# Patient Record
Sex: Male | Born: 1935 | Race: White | Hispanic: No | Marital: Married | Smoking: Former smoker
Health system: Southern US, Community
[De-identification: ages and names within clinical notes are randomized; demographics above are authoritative.]

## PROBLEM LIST (undated history)

## (undated) DIAGNOSIS — K279 Peptic ulcer, site unspecified, unspecified as acute or chronic, without hemorrhage or perforation: Secondary | ICD-10-CM

## (undated) DIAGNOSIS — N400 Enlarged prostate without lower urinary tract symptoms: Secondary | ICD-10-CM

## (undated) DIAGNOSIS — I10 Essential (primary) hypertension: Secondary | ICD-10-CM

## (undated) DIAGNOSIS — M1990 Unspecified osteoarthritis, unspecified site: Secondary | ICD-10-CM

## (undated) DIAGNOSIS — I2510 Atherosclerotic heart disease of native coronary artery without angina pectoris: Secondary | ICD-10-CM

## (undated) DIAGNOSIS — K219 Gastro-esophageal reflux disease without esophagitis: Secondary | ICD-10-CM

## (undated) DIAGNOSIS — G8929 Other chronic pain: Secondary | ICD-10-CM

## (undated) HISTORY — PX: KNEE SURGERY: SHX244

## (undated) HISTORY — PX: CAROTID ENDARTERECTOMY: SUR193

---

## 2010-11-04 ENCOUNTER — Emergency Department: Payer: Self-pay | Admitting: Internal Medicine

## 2011-09-01 ENCOUNTER — Observation Stay: Payer: Self-pay | Admitting: Family Medicine

## 2011-09-01 LAB — COMPREHENSIVE METABOLIC PANEL
Albumin: 3.7 g/dL (ref 3.4–5.0)
Anion Gap: 12 (ref 7–16)
BUN: 22 mg/dL — ABNORMAL HIGH (ref 7–18)
Bilirubin,Total: 0.7 mg/dL (ref 0.2–1.0)
Chloride: 100 mmol/L (ref 98–107)
Creatinine: 1.03 mg/dL (ref 0.60–1.30)
EGFR (African American): >60
Osmolality: 277 (ref 275–301)
Potassium: 4.2 mmol/L (ref 3.5–5.1)
Sodium: 137 mmol/L (ref 136–145)
Total Protein: 8.1 g/dL (ref 6.4–8.2)

## 2011-09-01 LAB — CK TOTAL AND CKMB (NOT AT ARMC): CK, Total: 32 U/L — ABNORMAL LOW (ref 35–232)

## 2011-09-01 LAB — CBC
MCH: 31.4 pg (ref 26.0–34.0)
MCHC: 33.9 g/dL (ref 32.0–36.0)
MCV: 93 fL (ref 80–100)
Platelet: 193 10*3/uL (ref 150–440)
RBC: 4.5 10*6/uL (ref 4.40–5.90)
RDW: 12.9 % (ref 11.5–14.5)

## 2011-09-01 LAB — URINALYSIS, COMPLETE
Bilirubin,UR: NEGATIVE
Blood: NEGATIVE
Ketone: NEGATIVE
Leukocyte Esterase: NEGATIVE
Ph: 6 (ref 4.5–8.0)
RBC,UR: 2 /HPF (ref 0–5)
Specific Gravity: 1.016 (ref 1.003–1.030)
Squamous Epithelial: NONE SEEN

## 2011-09-01 LAB — TROPONIN I: Troponin-I: <0.02 ng/mL

## 2011-09-01 LAB — PROTIME-INR: Prothrombin Time: 14.1 secs (ref 11.5–14.7)

## 2011-09-02 LAB — BASIC METABOLIC PANEL
Anion Gap: 11 (ref 7–16)
Calcium, Total: 8.2 mg/dL — ABNORMAL LOW (ref 8.5–10.1)
Co2: 28 mmol/L (ref 21–32)
Creatinine: 1.33 mg/dL — ABNORMAL HIGH (ref 0.60–1.30)
EGFR (African American): >60
EGFR (Non-African Amer.): 54 — ABNORMAL LOW
Glucose: 111 mg/dL — ABNORMAL HIGH (ref 65–99)
Osmolality: 287 (ref 275–301)
Sodium: 140 mmol/L (ref 136–145)

## 2011-09-03 ENCOUNTER — Encounter: Payer: Self-pay | Admitting: Internal Medicine

## 2011-09-03 LAB — BASIC METABOLIC PANEL
Anion Gap: 10 (ref 7–16)
Calcium, Total: 8 mg/dL — ABNORMAL LOW (ref 8.5–10.1)
Chloride: 102 mmol/L (ref 98–107)
Co2: 26 mmol/L (ref 21–32)
EGFR (African American): >60
EGFR (Non-African Amer.): >60
Glucose: 99 mg/dL (ref 65–99)
Osmolality: 280 (ref 275–301)
Potassium: 4.3 mmol/L (ref 3.5–5.1)
Sodium: 138 mmol/L (ref 136–145)

## 2011-09-03 LAB — CBC WITH DIFFERENTIAL/PLATELET
Basophil #: 0 10*3/uL (ref 0.0–0.1)
Basophil %: 0.5 %
Eosinophil #: 0.2 10*3/uL (ref 0.0–0.7)
Eosinophil %: 3.5 %
HGB: 11.5 g/dL — ABNORMAL LOW (ref 13.0–18.0)
Lymphocyte %: 25.9 %
MCH: 31.4 pg (ref 26.0–34.0)
MCHC: 33.7 g/dL (ref 32.0–36.0)
Monocyte #: 0.7 10*3/uL (ref 0.0–0.7)
Neutrophil %: 55.9 %
Platelet: 169 10*3/uL (ref 150–440)
RDW: 12.6 % (ref 11.5–14.5)
WBC: 4.9 10*3/uL (ref 3.8–10.6)

## 2011-09-07 ENCOUNTER — Encounter: Payer: Self-pay | Admitting: Internal Medicine

## 2013-09-22 ENCOUNTER — Emergency Department: Payer: Self-pay | Admitting: Emergency Medicine

## 2014-10-31 ENCOUNTER — Emergency Department
Admit: 2014-10-31 | Disposition: A | Discharge status: Home / Self Care | Payer: Self-pay | Admitting: Emergency Medicine

## 2014-10-31 LAB — CBC
HCT: 38.1 % — ABNORMAL LOW (ref 40.0–52.0)
HGB: 12.5 g/dL — ABNORMAL LOW (ref 13.0–18.0)
MCH: 31.2 pg (ref 26.0–34.0)
MCHC: 32.8 g/dL (ref 32.0–36.0)
MCV: 95 fL (ref 80–100)
PLATELETS: 201 10*3/uL (ref 150–440)
RBC: 4.01 10*6/uL — AB (ref 4.40–5.90)
RDW: 13 % (ref 11.5–14.5)
WBC: 7.4 10*3/uL (ref 3.8–10.6)

## 2014-10-31 LAB — COMPREHENSIVE METABOLIC PANEL
ALBUMIN: 3.9 g/dL
AST: 31 U/L
Alkaline Phosphatase: 89 U/L
Anion Gap: 10 (ref 7–16)
BUN: 23 mg/dL — AB
Bilirubin,Total: 0.6 mg/dL
CALCIUM: 8.9 mg/dL
CHLORIDE: 101 mmol/L
CO2: 24 mmol/L
CREATININE: 1.19 mg/dL
EGFR (Non-African Amer.): 53 — ABNORMAL LOW
Glucose: 128 mg/dL — ABNORMAL HIGH
Potassium: 4.8 mmol/L
SGPT (ALT): 9 U/L — ABNORMAL LOW
Sodium: 135 mmol/L
Total Protein: 7.7 g/dL

## 2014-10-31 LAB — URINALYSIS, COMPLETE
Bacteria: NONE SEEN
Bilirubin,UR: NEGATIVE
Blood: NEGATIVE
Glucose,UR: NEGATIVE mg/dL (ref 0–75)
Leukocyte Esterase: NEGATIVE
Nitrite: NEGATIVE
PH: 5 (ref 4.5–8.0)
Protein: 30
SQUAMOUS EPITHELIAL: NONE SEEN
Specific Gravity: 1.02 (ref 1.003–1.030)

## 2014-10-31 LAB — LIPASE, BLOOD: Lipase: 23 U/L

## 2014-10-31 LAB — TROPONIN I
TROPONIN-I: 0.04 ng/mL — AB
Troponin-I: 0.04 ng/mL — ABNORMAL HIGH

## 2014-10-31 NOTE — Consult Note (Signed)
Brief Consult Note: Diagnosis: Compression fracture, ostteoarthritis, no neurologic deficit.   Patient was seen by consultant.   Comments: Discussed with patient. Will see hjow he does with Physical Therapy and corset. Will need to be off plavix for 1 week if he decides to proceed. Has no restricitons.  Electronic Signatures: Celesta Gentilealiff, Blakley Michna C (MD)  (Signed [phone]-Feb-13 15:02)  Authored: Brief Consult Note   Last Updated: 24-Feb-13 15:02 by Celesta Gentilealiff, Gustava Berland C (MD)

## 2014-10-31 NOTE — Discharge Summary (Signed)
PATIENT NAME:  Aaron Erickson, Aaron Erickson MR#:  [id number] DATE OF BIRTH:  [birth year]  DATE OF ADMISSION:  09/01/2011 DATE OF DISCHARGE:  09/03/2011  DISCHARGE DIAGNOSES:  1. Acute back pain secondary to multilevel compression fractures.  2. Hypertension.  3. History of coronary artery disease.  4. Hyperlipidemia.   DISCHARGE MEDICATIONS:  1. Plavix 75 mg p.o. daily.  2. Hydrochlorothiazide 25 mg p.o. daily.  3. Lisinopril 20 mg p.o. daily.  4. Lopressor 100 mg p.o. daily.  5. Mevacor 40 mg p.o. daily. 6. Vitamin D/Calcium 800/1200 mg p.o. daily.  7. Calcitonin 200 mcg spray into nostrils alternating each day.  8. Tylenol 1000 mg t.i.d. scheduled p.o.  9. Ultram 50 mg p.o. t.i.d. p.r.n. for pain.  10. Oxycodone 5 mg p.o. q.4 hours p.r.n. for pain.  11. Ranitidine 150 mg p.o. b.i.d.  12. Hydroxyzine 25 mg p.o. q.6 hours p.r.n. for itching.   CONSULT: Dr. Gerrit Heckaliff with [facility]    PROCEDURES: None.   PERTINENT LABS: MRI of the back showed multilevel compression fractures. X-ray of the cervical spine was negative for acute fracture. Creatinine of 1.03.   BRIEF HOSPITAL COURSE:  1. Acute back pain secondary to compression fractures. The patient initially came in with moderate to severe back pain, found to have multilevel compression fractures seen on both CT and MRI. He was evaluated by Dr. Gerrit Heckaliff who did not recommend surgery at this time, would wait to see how he tolerates the course and the physical therapy and will need to hold the Plavix prior to any procedure. Plans are for him to go to [facility] for further rehab with PT and OT and pain control with meds listed above.  2. Other chronic medical issues remained stable. No changes to those regimens.   He is stable enough for discharge to [facility] for rehab. Follow-up with Dr. Burnadette PopLinthavong in 1 to 2 weeks.   ____________________________ Marisue IvanKanhka [name], MD kl:drc D: 09/03/2011 12:25:33  ET T: 09/03/2011 12:31:49 ET JOB#: [id number]  cc: Marisue IvanKanhka Lydiann Bonifas, MD, <Dictator> Marisue IvanKANHKA Mersadez Linden MD ELECTRONICALLY SIGNED 09/12/2011 13:35

## 2014-10-31 NOTE — H&P (Signed)
PATIENT NAME:  Aaron Erickson, Aaron Erickson MR#:  [id number] DATE OF BIRTH:  18-Apr-1921  DATE OF ADMISSION:  09/01/2011  PRIMARY CARE PHYSICIAN: Diona Fanti, MD  REFERRING PHYSICIAN: Lowella Fairy, MD  CHIEF COMPLAINT: Back pain for two days.   HISTORY OF PRESENT ILLNESS: The patient is an 81 year old Caucasian male with a history of hypertension, coronary artery disease, PVD, rheumatoid arthritis, and benign prostatic hypertrophy who presented to the ED with back pain for two days. The patient is alert, awake, and oriented but has hearing loss. According to him, he has had constipation for several days. He tried to have a bowel movement on Thursday, but developed back pain which is constant, sharp, and severe with no radiation. He could not walk due to back pain. Movement exacerbated the pain. In addition, he had neck pain on the left side today; however, the patient denies any extremity weakness, numbness, or tingling; no incontinence. The patient mentioned that he had a back injury 30 years ago. He was in bed for two weeks. He also remembered that he fell by accident last year, but no recent fall or trauma. He uses a walker and he lives alone. MRI of the spine showed acute and chronic spine fracture. The patient's blood pressure was high, more than 200, in the ED, and now it is 163. The patient still complains of neck pain and back pain.   PAST MEDICAL HISTORY:  1. Hypertension.  2. Coronary artery disease.  3. Peripheral vascular disease.  4. Benign prostatic hypertrophy.  5. Hyperlipidemia. 6. Gastroesophageal reflux disease.  7. Rheumatoid arthritis.  DRUG ALLERGIES: Sulfa.   MEDICATIONS:  1. Benadryl 25 mg p.o. three times daily p.r.n.  2. Plavix 75 mg p.o. daily.  3. HCTZ 25 mg p.o. daily.  4. Lisinopril 20 mg p.o. daily.  5. Lopressor 100 mg p.o. daily.  6. Mevacor 40 mg p.o. daily.  7. Ranitidine 150 mg p.o. twice daily.  8. Tylenol 500 mg p.o. every 6 hours p.r.n. 9. Hydroxyzine  hydrochloride 25 mg p.o. four times daily p.r.n.   SOCIAL HISTORY: He denies any smoking, alcohol drinking, or illicit drugs.   FAMILY HISTORY: Hypertension.  REVIEW OF SYSTEMS: CONSTITUTIONAL: The patient denies any fever, sometimes headache, but no dizziness. No weight loss. EYES: No blurry vision, double vision, or discharge. ENT: Has hearing loss, but no ear pain or epistaxis. No postnasal drip. RESPIRATORY: No cough, sputum, shortness of breath, or hemoptysis. CARDIOVASCULAR: No chest pain, palpitations, or orthopnea. No nocturnal dyspnea. GI: No abdominal pain, nausea, vomiting, or diarrhea. No melena or bloody stool. GU: No dysuria, hematuria, or incontinence. SKIN: No rash or jaundice. ENDOCRINE: No polyuria or polydipsia. HEMATOLOGY: No easy bruising or bleeding. NEUROLOGY: No syncope, loss of consciousness, or seizure. MUSCULOSKELETAL: Positive for back pain and neck pain. No edema. PSYCH: No anxiety or depression.   PHYSICAL EXAMINATION:   VITALS: Temperature 96.2, blood pressure 163/81, pulse 51, respirations 18, oxygen saturation 96% on room air.   GENERAL: The patient is alert, awake, and oriented, in no acute distress.   HEENT: Pupils are round, equal, and reactive to light and accommodation. Moist oral mucosa. Clear oropharynx.   NECK: Supple. No JVD or carotid bruits. No lymphadenopathy. No thyromegaly.   CARDIOVASCULAR: S1 and S2 regular rate and rhythm, bradycardia. No murmurs, rubs, or gallops.   PULMONARY: Bilateral air entry. No wheezing or rales. No use of accessory muscles to breathe.   ABDOMEN: Soft. No distention. No tenderness. No organomegaly. Bowel sounds present.  EXTREMITIES: No edema, clubbing, or cyanosis. No calf tenderness. Strong pedal pulses.  NEUROLOGIC: Alert and oriented x3. No focal deficits. Power 5/5. Sensation intact.   SKIN: No rash or jaundice.   LABS/STUDIES: MRI showed an acute benign appearing mild to moderate compression fracture of L2,  chronic moderate compression fracture of L1, and mild compression fractures of T12 and L4, and mild multilevel disc and facet disease.  Urinalysis is negative.  CAT scan of the abdomen and pelvis: No abdominal aortic aneurysm. No acute findings of abdomen or pelvis. Mild compression fractures of T12, L2, L4, and L1. Large hiatal hernia.   Troponin less than 0.02. CK 32. INR 1.1. PT 14.1. WBC 8.3, hemoglobin 14.1, platelets 193. Glucose 96, BUN 22, creatinine 1.03, sodium 137, potassium 4.2, bicarbonate 25, chloride 100. BNP 969.   Chest x-ray: Mild right basilar opacities likely secondary to atelectasis or scarring. Moderate size hiatal hernia.   IMPRESSION:  1. Back pain.  2. Multiple spine fractures.  3. Hypertensive emergency.  4. Dehydration.  5. Bradycardia. 6. Constipation.  7. History of coronary artery disease, peripheral vascular disease, benign prostatic hypertrophy, and rheumatoid arthritis.  PLAN OF TREATMENT: The patient will be placed for observation. We will give pain management with morphine p.r.n. and Fentanyl patch. We will continue Lopressor and lisinopril, but discontinue HCTZ due to dehydration, and add hydralazine. We will give half normal saline IV and follow up BMP. For constipation we will give Colace and Milk of Magnesia p.r.n. We will get a PT evaluation and case management for nursing home placement due to the patient's status. I discussed with the patient the  situation and the plan of treatment with the patient and the patient's nephew.   TIME SPENT: Approximately 70 minutes. ____________________________ Shaune PollackQing Luka Reisch, MD qc:slb D: 09/01/2011 21:58:32 ET T: 09/02/2011 09:27:43 ET JOB#: [id number]  cc: Shaune PollackQing Zeola Brys, MD, <Dictator> Carla DrapeLarry O. Harper, MD Shaune PollackQING Treyana Sturgell MD ELECTRONICALLY SIGNED 09/02/2011 20:19

## 2014-11-02 ENCOUNTER — Inpatient Hospital Stay
Admit: 2014-11-02 | Disposition: A | Discharge status: Other Acute Inpatient Hospital | Payer: Self-pay | Attending: Internal Medicine | Admitting: Internal Medicine

## 2014-11-02 LAB — TROPONIN I
Troponin-I: 0.28 ng/mL — ABNORMAL HIGH
Troponin-I: 0.23 ng/mL — ABNORMAL HIGH

## 2014-11-02 LAB — BASIC METABOLIC PANEL
Anion Gap: 9 (ref 7–16)
BUN: 30 mg/dL — ABNORMAL HIGH
CHLORIDE: 101 mmol/L
CO2: 26 mmol/L
CREATININE: 1.43 mg/dL — AB
Calcium, Total: 8.2 mg/dL — ABNORMAL LOW
GFR CALC AF AMER: 49 — AB
GFR CALC NON AF AMER: 42 — AB
Glucose: 109 mg/dL — ABNORMAL HIGH
Potassium: 4.2 mmol/L
Sodium: 136 mmol/L

## 2014-11-02 LAB — URINALYSIS, COMPLETE
Bilirubin,UR: NEGATIVE
Blood: NEGATIVE
Glucose,UR: NEGATIVE mg/dL (ref 0–75)
LEUKOCYTE ESTERASE: NEGATIVE
Nitrite: NEGATIVE
Ph: 5 (ref 4.5–8.0)
Protein: 100
Specific Gravity: 1.027 (ref 1.003–1.030)
Squamous Epithelial: NONE SEEN

## 2014-11-02 LAB — CBC WITH DIFFERENTIAL/PLATELET
BASOS ABS: 0 10*3/uL (ref 0.0–0.1)
Basophil %: 0.7 %
EOS ABS: 0 10*3/uL (ref 0.0–0.7)
Eosinophil %: 0.1 %
HCT: 37.1 % — ABNORMAL LOW (ref 40.0–52.0)
HGB: 12.3 g/dL — ABNORMAL LOW (ref 13.0–18.0)
LYMPHS ABS: 1.1 10*3/uL (ref 1.0–3.6)
LYMPHS PCT: 26.5 %
MCH: 31.6 pg (ref 26.0–34.0)
MCHC: 33.1 g/dL (ref 32.0–36.0)
MCV: 95 fL (ref 80–100)
MONOS PCT: 18.8 %
Monocyte #: 0.8 x10 3/mm (ref 0.2–1.0)
NEUTROS ABS: 2.3 10*3/uL (ref 1.4–6.5)
NEUTROS PCT: 53.9 %
Platelet: 190 10*3/uL (ref 150–440)
RBC: 3.89 10*6/uL — AB (ref 4.40–5.90)
RDW: 13.8 % (ref 11.5–14.5)
WBC: 4.2 10*3/uL (ref 3.8–10.6)

## 2014-11-02 LAB — HEPATIC FUNCTION PANEL A (ARMC)
AST: 42 U/L — AB
Albumin: 3.4 g/dL — ABNORMAL LOW
Alkaline Phosphatase: 74 U/L
BILIRUBIN TOTAL: 0.3 mg/dL
SGPT (ALT): 15 U/L — ABNORMAL LOW
TOTAL PROTEIN: 6.6 g/dL

## 2014-11-02 LAB — PRO B NATRIURETIC PEPTIDE: B-Type Natriuretic Peptide: 174 pg/mL — ABNORMAL HIGH

## 2014-11-03 LAB — TROPONIN I: Troponin-I: 0.17 ng/mL — ABNORMAL HIGH

## 2014-11-03 LAB — BASIC METABOLIC PANEL
ANION GAP: 7 (ref 7–16)
BUN: 25 mg/dL — AB
CHLORIDE: 105 mmol/L
Calcium, Total: 7.9 mg/dL — ABNORMAL LOW
Co2: 23 mmol/L
Creatinine: 1.08 mg/dL
EGFR (African American): >60
EGFR (Non-African Amer.): 59 — ABNORMAL LOW
Glucose: 127 mg/dL — ABNORMAL HIGH
Potassium: 4.3 mmol/L
Sodium: 135 mmol/L

## 2014-11-03 LAB — CBC WITH DIFFERENTIAL/PLATELET
BASOS ABS: 0 10*3/uL (ref 0.0–0.1)
Basophil %: 0.7 %
Eosinophil #: 0 10*3/uL (ref 0.0–0.7)
Eosinophil %: 0 %
HCT: 36.7 % — AB (ref 40.0–52.0)
HGB: 11.8 g/dL — ABNORMAL LOW (ref 13.0–18.0)
LYMPHS PCT: 20.1 %
Lymphocyte #: 0.9 10*3/uL — ABNORMAL LOW (ref 1.0–3.6)
MCH: 30.8 pg (ref 26.0–34.0)
MCHC: 32.2 g/dL (ref 32.0–36.0)
MCV: 96 fL (ref 80–100)
Monocyte #: 0.1 x10 3/mm — ABNORMAL LOW (ref 0.2–1.0)
Monocyte %: 3.2 %
NEUTROS ABS: 3.4 10*3/uL (ref 1.4–6.5)
Neutrophil %: 76 %
PLATELETS: 162 10*3/uL (ref 150–440)
RBC: 3.85 10*6/uL — ABNORMAL LOW (ref 4.40–5.90)
RDW: 13.5 % (ref 11.5–14.5)
WBC: 4.5 10*3/uL (ref 3.8–10.6)

## 2014-11-07 NOTE — H&P (Signed)
PATIENT NAME:  Aaron Erickson, Aaron Erickson MR#:  [id number] DATE OF BIRTH:  April 14, 1921  DATE OF ADMISSION:  11/02/2014  PRIMARY CARE PROVIDER:  Marisue Ivan, MD.     CHIEF COMPLAINT: Weakness, cough, shortness of breath.   HISTORY OF PRESENT ILLNESS:  A 81 year old male patient with history of CAD, hypertension, rheumatoid arthritis, who was recently seen in the Emergency Room for pneumonia 3 days prior, returns with increasing weakness. The patient mentioned that he has had cough with brown sputum, but no blood. He has also had some shortness of breath and increasing weakness. He normally ambulates with a walker, but has been unable to do that due to weakness. He is not needing any oxygen. He is afebrile, but in spite of being on outpatient therapy for pneumonia the patient has worsened. Also his troponin is elevated at 0.28 and he is being admitted to the hospitalist service.   Today chest x-ray shows small right-sided pleural effusion, large hiatal hernia, no infiltrate, no  edema. He does not complain of any orthopnea or lower extremity edema, has had good urine output. He had 1 episode of diarrhea yesterday. No vomiting. Has had decreased appetite.   PAST MEDICAL HISTORY:  1. CAD.   2. Hypertension.  3. Peripheral vascular disease.  4. BPH.   5. Hyperlipidemia.  6. GERD.  7. Rheumatoid arthritis.   ALLERGIES: SULFA MEDICATIONS.   SOCIAL HISTORY: The patient lives at independent living facility. Does not smoke. No alcohol. No illicit drugs. Ambulates with a walker.   CODE STATUS: DNR/DNI.   FAMILY HISTORY: Hypertension per old records.   REVIEW OF SYSTEMS:   CONSTITUTIONAL: Complains of fatigue and weakness.  EYES: No blurred vision, pain, redness.  EARS, NOSE, AND THROAT: No tinnitus. Does have hearing loss.  RESPIRATORY: He has shortness of breath, cough, brown sputum.  CARDIOVASCULAR: No chest pain, orthopnea, edema.  GASTROINTESTINAL: No nausea, vomiting, diarrhea, abdominal pain.   GENITOURINARY:  No dysuria, hematuria.   ENDOCRINE: No polyuria, nocturia, thyroid problems.  HEMATOLOGIC AND LYMPHATIC: No anemia, easy bruising, bleeding.  INTEGUMENTARY: No acne, rash, lesion.  MUSCULOSKELETAL: Has arthritis, back pain.  NEUROLOGIC: No focal numbness, weakness.  PSYCHIATRIC: No anxiety or depression.   HOME MEDICATIONS:  1. Aspirin 81 mg daily.  2. Plavix 75 mg daily.  3. Losartan 50 mg daily.  4. Calcitonin nasal spray daily.  5. Calcium and vitamin D 1 tablet daily.  6. Cetirizine 10 mg day.  7. Docusate sodium 100 mg 2 times a day as needed for constipation.  8. Fluticasone nasal 2 sprays in each nostril once a day.  9. Gabapentin 100 mg 2 capsules daily at night.  10. Milk of magnesia 30 mL oral once a day as needed for constipation.  11. Zofran 4 mg every 6 hours as needed for nausea and vomiting.  12. Q-Tussin 10 mL oral every 6 hours as needed for cough.  13. RANITIDINE 150 mg 2 times a day.  14. Senna 8.6 mg 2 tablets 2 times a day.  15. Tramadol 50 mg oral 3 times a day as needed for pain.  16. Vitamin B12, 1000 mcg daily.    PHYSICAL EXAMINATION:  VITAL SIGNS: Temperature 98.1, pulse of 78, blood pressure 114/70, saturating 94% on room air.  GENERAL: Elderly, frail Caucasian male patient lying in bed, coughing.  PSYCHIATRIC: Alert and oriented x 3, pleasant.  HEENT: Atraumatic, normocephalic. Oral mucosa moist and pink. External ears and nose normal. No pallor. No icterus. Pupils bilaterally equal  and reactive to light. Decreased hearing. Hearing aid in place.  NECK: Supple. No thyromegaly. No palpable lymph nodes. Trachea midline. No carotid bruit, JVD.  CARDIOVASCULAR: S1, S2, no murmurs. Peripheral pulses 2 +. No edema.  RESPIRATORY: Has bilateral wheezing. Normal work of breathing.  GASTROINTESTINAL: Soft abdomen, nontender. Bowel sounds present. No hepatosplenomegaly palpable.  GENITOURINARY: No CVA tenderness or bladder distention.  SKIN: Warm  and dry. No petechiae, rash, ulcers.  MUSCULOSKELETAL: No joint swelling, redness, effusion of the large joints. Normal muscle tone.  NEUROLOGICAL: Motor strength 5 out of 5 in upper and lower extremities. Sensation to fine touch intact all over.  LYMPHATIC: No cervical lymphadenopathy.   LABORATORY STUDIES AND IMAGING STUDIES: Show glucose of 109. BNP of 174. BUN 30, creatinine 1.43, sodium 136, potassium 4.2, chloride 101 with AST, ALT, alkaline phosphatase, bilirubin normal. Troponin 0.28, recent troponin was 0.04 two days prior. WBC 4.2, hemoglobin 12.3, platelets of 190,000.   Urinalysis shows trace bacteria, 0-5 WBCs.   EKG shows normal sinus rhythm, no acute ST-T wave changes.   Chest x-ray shows large hiatal hernia along with a small right pleural effusion and elevated left diaphragm with atelectasis, no infiltrate or edema found.   ASSESSMENT AND PLAN:  1.  Acute bronchitis. The patient has worsened in spite of being on antibiotics, cough medications as outpatient. At this point he will be started on IV antibiotics along with IV Solu-Medrol and scheduled nebulizer therapy. He also has mild acute renal failure. We will start him on IV fluids for the same. Oxygen as needed.  2.  Elevated troponin. Mild elevation of 0.28, recently it was 0.04. This is likely from demand ischemia. He does have history of coronary artery disease, but with his advanced age he would not be a candidate for cardiac catheterization. If there is significant elevation of troponin we will need to consult cardiology, but at this point I will continue his aspirin, Plavix, and put him on a small dose of beta blocker of metoprolol 25 daily. Also start patient on a statin.  3.  Hypertension. Started on metoprolol and discontinue losartan.  4.  Chronic pain syndrome. Continue his tramadol that he takes at home.  5.  Deep vein thrombosis prophylaxis with Lovenox.  6.  Code status: DNR, DNI as discussed with the patient. He  is alert and oriented x 3, able to make decisions, mentions that his wife was a DNR, did not want any life support, and he would like the same. Tried to called his daughter and granddaughter, but did not answer the phone.   TIME SPENT TODAY ON THIS CASE:  40 minutes.     ____________________________ Molinda BailiffSrikar R. Janat Tabbert, MD srs:bu D: 11/02/2014 21:02:51 ET T: 11/02/2014 21:16:30 ET JOB#: [id number]  cc: Wardell HeathSrikar R. Nur Krasinski, MD, <Dictator> Marisue IvanKanhka Linthavong, MD Orie FishermanSRIKAR R Paulino Cork MD ELECTRONICALLY SIGNED 11/05/2014 16:25

## 2014-11-07 NOTE — Discharge Summary (Signed)
Dates of Admission and Diagnosis:  Date of Admission 02-Nov-2014   Date of Discharge 04-Nov-2014   Admitting Diagnosis Acute bronchitis   Final Diagnosis 1. Acute bronchitis 2. Weakness 3. Elevated troponin due to demand ischemia 4. acute renal failure 5. coronary artery disease 6. chronic resting tremor worse due to steroids    Chief Complaint/History of Present Illness PRIMARY CARE PROVIDER:  Dion Body, MD.     CHIEF COMPLAINT: Weakness, cough, shortness of breath.   HISTORY OF PRESENT ILLNESS:  A 81 year old male patient with history of CAD, hypertension, rheumatoid arthritis, who was recently seen in the Emergency Room for pneumonia 3 days prior, returns with increasing weakness. The patient mentioned that he has had cough with brown sputum, but no blood. He has also had some shortness of breath and increasing weakness. He normally ambulates with a walker, but has been unable to do that due to weakness. He is not needing any oxygen. He is afebrile, but in spite of being on outpatient therapy for pneumonia the patient has worsened. Also his troponin is elevated at 0.28 and he is being admitted to the hospitalist service.   Today chest x-ray shows small right-sided pleural effusion, large hiatal hernia, no infiltrate, no  edema. He does not complain of any orthopnea or lower extremity edema, has had good urine output. He had 1 episode of diarrhea yesterday. No vomiting. Has had decreased appetite.   Allergies:  Sulfa: Other  Routine Chem:  27-Apr-16 01:00   Result Comment - TROPONIN  - PREVIOUSLY CALLED 11-02-14  - _0  BY [facility]...AJO  Result(s) reported on 03 Nov 2014 at 01:49AM.  Glucose, Serum  127 (65-99 NOTE: New Reference Range  09/14/14)  BUN  25 (6-20 NOTE: New Reference Range  09/14/14)  Creatinine (comp) 1.08 (0.61-1.24 NOTE: New Reference Range  09/14/14)  Sodium, Serum 135 (135-145 NOTE: New Reference Range  09/14/14)  Potassium, Serum 4.3  (3.5-5.1 NOTE: New Reference Range  09/14/14)  Chloride, Serum 105 (101-111 NOTE: New Reference Range  09/14/14)  CO2, Serum 23 (22-32 NOTE: New Reference Range  09/14/14)  Calcium (Total), Serum  7.9 (8.9-10.3 NOTE: New Reference Range  09/14/14)  Anion Gap 7  eGFR (African American) >60  eGFR (Non-African American)  59 (eGFR values <68m/min/1.73 m2 may be an indication of chronic kidney disease (CKD). Calculated eGFR is useful in patients with stable renal function. The eGFR calculation will not be reliable in acutely ill patients when serum creatinine is changing rapidly. It is not useful in patients on dialysis. The eGFR calculation may not be applicable to patients at the low and high extremes of body sizes, pregnant women, and vegetarians.)  Cardiac:  27-Apr-16 01:00   Troponin I  0.17 (0.00-0.03 0.03 ng/mL or less: NEGATIVE  Repeat testing in 3-6 hrs  if clinically indicated. >0.05 ng/mL: POTENTIAL  MYOCARDIAL INJURY. Repeat  testing in 3-6 hrs if  clinically indicated. NOTE: An increase or decrease  of 30% or more on serial  testing suggests a  clinically important change NOTE: New Reference Range  09/14/14)  Routine Hem:  27-Apr-16 01:00   WBC (CBC) 4.5  RBC (CBC)  3.85  Hemoglobin (CBC)  11.8  Hematocrit (CBC)  36.7  Platelet Count (CBC) 162  MCV 96  MCH 30.8  MCHC 32.2  RDW 13.5  Neutrophil % 76.0  Lymphocyte % 20.1  Monocyte % 3.2  Eosinophil % 0.0  Basophil % 0.7  Neutrophil # 3.4  Lymphocyte #  0.9  Monocyte #  0.1  Eosinophil # 0.0  Basophil # 0.0 (Result(s) reported on 03 Nov 2014 at 01:10AM.)   Pertinent Past History:  Pertinent Past History PAST MEDICAL HISTORY:  1. CAD.   2. Hypertension.  3. Peripheral vascular disease.  4. BPH.   5. Hyperlipidemia.  6. GERD.  7. Rheumatoid arthritis.   Hospital Course:  Hospital Course 81 yo male w/ hx of HTN, PVD, CAD, BPH, RA, came into hospital due to shortness of breath, cough,  weakness.   1. Acute Bronchitis - likely cause of pt's shortness of breath improved.  - failed outpatient therapy and admitted and started on IV steroids and oral Levaquin.  - feels better and wheezing bronchospasm has improved.   2. Elevated Troponin - likely in the setting of demand ishcemia.  - no chest pain and no acute symptoms.  - cont. ASA, Plavix, Statin, B-blocker.   3. Acute Renal Failure - likely due to dehydration, ATN - improved w/ IV fluids and will monitor.   4. hx of CAD - cont. ASA, Plavix, B-blocker, Statin  5. Weakness/generalized - likely related to bronchitis/deconditioning.   Feesl better. still weak but set up for HH with PT  Time spent on discharge 40 minutes.   Condition on Discharge Fair   Code Status:  Code Status No Code/Do Not Resuscitate   PHYSICAL EXAM ON DISCHARGE:  Physical Exam:  GEN no acute distress, thin   NECK supple  No masses   RESP normal resp effort  clear BS   DISCHARGE INSTRUCTIONS HOME MEDS:  Medication Reconciliation: Patient's Home Medications at Discharge:     Medication Instructions  clopidogrel 75 mg oral tablet  1 tab(s) orally once a day   calcitonin 200 intl units/inh nasal spray  1 spray(s) in 1 nostril once a day *altenating nostrils daily*   calcium 600+d  1 tab(s) orally 2 times a day (with meals).   cetirizine 10 mg oral tablet  1 tab(s) orally once a day   fluticasone nasal 50 mcg/inh nasal spray  2 spray(s) in each nostril once a day.   gabapentin 100 mg oral capsule  2 caps (200mg) orally once a day (in the evening).   icaps  1 cap(s) orally once a day   losartan 25 mg oral tablet  1 tab(s) orally once a day   mapap 500 mg oral tablet  2 tabs (1,000mg) orally 3 times a day.   ranitidine 150 mg oral tablet  2 tabs (300mg) orally 2 times a day (8am and 4pm).   senna 8.6 mg oral tablet  2 tabs (17.2mg) orally 2 times a day. *hold for diarrhea*   systane balance preserved ophthalmic solution  1 drop(s) in  both eyes 2 times a day   vitamin b12 1000 mcg oral tablet  1 tab(s) orally once a day   baza-protect - topical cream  Apply topically to buttocks 3 times a day as needed for incontinence.   docusate sodium sodium 100 mg oral capsule  1 cap(s) orally 2 times a day as needed for constipation.   geri-lanta 200 mg-200 mg-20 mg/5 ml oral suspension  30 milliliter(s) orally every 4 hours as needed.   loperamide 2 mg oral capsule  1 cap(s) orally with each loose stool every 3 hours as needed for diarrhea. *not to exceed 8 doses/24 hours*   mi-acid 400 mg-400 mg-40 mg/5 ml oral suspension  30 milliliter(s) orally up to 4 times a day as needed for indigestion, heartburn. *not to   exceed 4 doses/24 hours*   milk of magnesia 8% oral suspension  30 milliliter(s) orally once a day (at bedtime) as needed for constipation.   minerin cream  apply to affected area topically 2 times a day as needed for dry skin.   ondansetron 4 mg oral tablet  1 tab(s) orally every 6 hours as needed for nausea or vomiting.   q-tussin 100 mg/5 ml oral liquid  10 milliliter(s) orally every 6 hours as needed for cough.   tramadol 50 mg oral tablet  1 tab(s) orally 3 times a day as needed  for pain.   triple antibiotic 400 units-3.5 mg-5000 units/g topical ointment  Apply topically to affected area once a day, As Needed   levaquin 500 mg oral tablet  1 tab(s) orally once a day   prednisone 20 mg oral tablet  2 tab(s) orally once a day   proair hfa 90 mcg/inh inhalation aerosol  2 puff(s) inhaled every 4 hours, As Needed - for Shortness of Breath    STOP TAKING THE FOLLOWING MEDICATION(S):    polyethylene glycol 3350 - oral powder for reconstitution: 1 capful (17 grams) in 8 ounces of fluid and drink orally once a day.  Physician's Instructions:  Home Health? Yes   Home Health Service Physicial Therapy   Diet Low Sodium   Activity Limitations As tolerated  walker   Return to Work Not Applicable   Time frame for Follow Up  Appointment 1-2 weeks  primary care provider   Other Comments Your tremors should improve once off steroids   Electronic Signatures: ,  Reddy (MD)  (Signed 29-Apr-16 16:15)  Authored: ADMISSION DATE AND DIAGNOSIS, CHIEF COMPLAINT/HPI, Allergies, PERTINENT LABS, PERTINENT PAST HISTORY, HOSPITAL COURSE, PHYSICAL EXAM ON DISCHARGE, DISCHARGE INSTRUCTIONS HOME MEDS, PATIENT INSTRUCTIONS   Last Updated: 29-Apr-16 16:15 by ,  Reddy (MD) 

## 2014-11-10 ENCOUNTER — Other Ambulatory Visit: Payer: Self-pay

## 2014-11-10 ENCOUNTER — Emergency Department
Admission: EM | Admit: 2014-11-10 | Discharge: 2014-11-10 | Disposition: A | Discharge status: Home / Self Care | Payer: Medicare Other | Attending: Student | Admitting: Student

## 2014-11-10 ENCOUNTER — Emergency Department: Payer: Medicare Other

## 2014-11-10 ENCOUNTER — Encounter: Payer: Self-pay | Admitting: *Deleted

## 2014-11-10 ENCOUNTER — Emergency Department (HOSPITAL_COMMUNITY): Payer: Medicare Other

## 2014-11-10 DIAGNOSIS — E860 Dehydration: Secondary | ICD-10-CM | POA: Insufficient documentation

## 2014-11-10 DIAGNOSIS — Z79899 Other long term (current) drug therapy: Secondary | ICD-10-CM | POA: Insufficient documentation

## 2014-11-10 DIAGNOSIS — R531 Weakness: Secondary | ICD-10-CM | POA: Diagnosis present

## 2014-11-10 DIAGNOSIS — Z7951 Long term (current) use of inhaled steroids: Secondary | ICD-10-CM | POA: Insufficient documentation

## 2014-11-10 DIAGNOSIS — I10 Essential (primary) hypertension: Secondary | ICD-10-CM | POA: Diagnosis not present

## 2014-11-10 DIAGNOSIS — Z7902 Long term (current) use of antithrombotics/antiplatelets: Secondary | ICD-10-CM | POA: Diagnosis not present

## 2014-11-10 DIAGNOSIS — Z87891 Personal history of nicotine dependence: Secondary | ICD-10-CM | POA: Insufficient documentation

## 2014-11-10 DIAGNOSIS — R110 Nausea: Secondary | ICD-10-CM

## 2014-11-10 HISTORY — DX: Benign prostatic hyperplasia without lower urinary tract symptoms: N40.0

## 2014-11-10 HISTORY — DX: Other chronic pain: G89.29

## 2014-11-10 HISTORY — DX: Essential (primary) hypertension: I10

## 2014-11-10 HISTORY — DX: Atherosclerotic heart disease of native coronary artery without angina pectoris: I25.10

## 2014-11-10 HISTORY — DX: Gastro-esophageal reflux disease without esophagitis: K21.9

## 2014-11-10 HISTORY — DX: Unspecified osteoarthritis, unspecified site: M19.90

## 2014-11-10 HISTORY — DX: Peptic ulcer, site unspecified, unspecified as acute or chronic, without hemorrhage or perforation: K27.9

## 2014-11-10 LAB — CBC WITH DIFFERENTIAL/PLATELET
BASOS PCT: 0 %
Basophils Absolute: 0 10*3/uL (ref 0–0.1)
EOS ABS: 0 10*3/uL (ref 0–0.7)
EOS PCT: 0 %
HCT: 36 % — ABNORMAL LOW (ref 40.0–52.0)
HEMOGLOBIN: 11.6 g/dL — AB (ref 13.0–18.0)
Lymphocytes Relative: 8 %
Lymphs Abs: 0.9 10*3/uL — ABNORMAL LOW (ref 1.0–3.6)
MCH: 30.5 pg (ref 26.0–34.0)
MCHC: 32.3 g/dL (ref 32.0–36.0)
MCV: 94.7 fL (ref 80.0–100.0)
MONOS PCT: 12 %
Monocytes Absolute: 1.5 10*3/uL — ABNORMAL HIGH (ref 0.2–1.0)
NEUTROS PCT: 80 %
Neutro Abs: 9.9 10*3/uL — ABNORMAL HIGH (ref 1.4–6.5)
Platelets: 216 10*3/uL (ref 150–440)
RBC: 3.81 MIL/uL — AB (ref 4.40–5.90)
RDW: 13.3 % (ref 11.5–14.5)
WBC: 12.3 10*3/uL — ABNORMAL HIGH (ref 3.8–10.6)

## 2014-11-10 LAB — COMPREHENSIVE METABOLIC PANEL
ALBUMIN: 3.1 g/dL — AB (ref 3.5–5.0)
ALT: 11 U/L — ABNORMAL LOW (ref 17–63)
ANION GAP: 9 (ref 5–15)
AST: 19 U/L (ref 15–41)
Alkaline Phosphatase: 56 U/L (ref 38–126)
BILIRUBIN TOTAL: 0.9 mg/dL (ref 0.3–1.2)
BUN: 30 mg/dL — AB (ref 6–20)
CALCIUM: 8.8 mg/dL — AB (ref 8.9–10.3)
CO2: 28 mmol/L (ref 22–32)
CREATININE: 1.1 mg/dL (ref 0.61–1.24)
Chloride: 100 mmol/L — ABNORMAL LOW (ref 101–111)
GFR calc Af Amer: >60 mL/min (ref >60)
GFR calc non Af Amer: 56 mL/min — ABNORMAL LOW (ref >60)
Glucose, Bld: 142 mg/dL — ABNORMAL HIGH (ref 65–99)
Potassium: 3.5 mmol/L (ref 3.5–5.1)
Sodium: 137 mmol/L (ref 135–145)
Total Protein: 6.1 g/dL — ABNORMAL LOW (ref 6.5–8.1)

## 2014-11-10 LAB — URINALYSIS COMPLETE WITH MICROSCOPIC (ARMC ONLY)
Bilirubin Urine: NEGATIVE
Glucose, UA: NEGATIVE mg/dL
Hgb urine dipstick: NEGATIVE
Leukocytes, UA: NEGATIVE
Nitrite: NEGATIVE
PH: 5 (ref 5.0–8.0)
Protein, ur: NEGATIVE mg/dL
Specific Gravity, Urine: 1.028 (ref 1.005–1.030)

## 2014-11-10 LAB — PROTIME-INR
INR: 1.03
PROTHROMBIN TIME: 13.7 s (ref 11.4–15.0)

## 2014-11-10 LAB — TROPONIN I: Troponin I: 0.05 ng/mL — ABNORMAL HIGH (ref <0.031)

## 2014-11-10 LAB — LIPASE, BLOOD: LIPASE: 25 U/L (ref 22–51)

## 2014-11-10 MED ORDER — ASPIRIN 81 MG PO CHEW
324.0000 mg | CHEWABLE_TABLET | Freq: Once | ORAL | Status: AC
  Administered 2014-11-10: 324 mg via ORAL

## 2014-11-10 MED ORDER — ASPIRIN 81 MG PO CHEW
CHEWABLE_TABLET | ORAL | Status: AC
  Filled 2014-11-10: qty 4

## 2014-11-10 MED ORDER — SODIUM CHLORIDE 0.9 % IV BOLUS (SEPSIS)
500.0000 mL | Freq: Once | INTRAVENOUS | Status: AC
  Administered 2014-11-10: 500 mL via INTRAVENOUS

## 2014-11-10 NOTE — ED Notes (Signed)
Report called to Lav[facility] at [facility], pt to be transported via EMS per [facility] Maxwell Lemen, Maryann Alarlivia S, RN

## 2014-11-10 NOTE — ED Provider Notes (Signed)
[facility] Emergency Department Provider Note    ____________________________________________  Time seen: ----------------------------------------- 11:35 AM on 11/10/2014 -----------------------------------------    I have reviewed the triage vital signs and the nursing notes.   HISTORY  Chief Complaint Weakness    HPI JAYMARI CROMIE is a 81 y.o. male with history of coronary artery disease, hypertension, hyperlipidemia, recent diagnosis of pneumonia with inpatient treatment and discharge at the end of April 2016 presents from [facility] for generalized weakness, poor appetite for the past several days. He reports his breathing is improved, he has nausea but no vomiting or diarrhea. No fevers. No chest pain. No recent falls. Going on for 3-4 days. Severity is mild to moderate. He just feels weak. Only new medication that he mentioned Levaquin. No modifying factors. Associated symptoms as above.     Past Medical History  Diagnosis Date  . Hypertension   . Coronary artery disease   . GERD (gastroesophageal reflux disease)   . Chronic pain   . Peptic ulcer   . BPH (benign prostatic hyperplasia)   . Arthritis     There are no active problems to display for this patient.   Past Surgical History  Procedure Laterality Date  . Knee surgery    . Carotid endarterectomy      Current Outpatient Rx  Name  Route  Sig  Dispense  Refill  . acetaminophen (TYLENOL) 500 MG tablet   Oral   Take 1,000 mg by mouth 3 (three) times daily.         . Calcium Carbonate-Vitamin D (CALCIUM + D PO)   Oral   Take 1 tablet by mouth 2 (two) times daily with a meal.         . cetirizine (ZYRTEC) 10 MG tablet   Oral   Take 10 mg by mouth daily.         . clopidogrel (PLAVIX) 75 MG tablet   Oral   Take 75 mg by mouth daily.         Marland Kitchen docusate sodium (COLACE) 100 MG capsule   Oral   Take 100 mg by mouth 2 (two) times daily as needed for mild  constipation.         . gabapentin (NEURONTIN) 100 MG capsule   Oral   Take 200 mg by mouth daily.          Marland Kitchen guaifenesin (ROBITUSSIN) 100 MG/5ML syrup   Oral   Take 200 mg by mouth 4 (four) times daily as needed for cough.         . losartan (COZAAR) 25 MG tablet   Oral   Take 25 mg by mouth daily.          . magnesium hydroxide (MILK OF MAGNESIA) 400 MG/5ML suspension   Oral   Take 30 mLs by mouth daily as needed for mild constipation.         . Multiple Vitamins-Minerals (ICAPS AREDS FORMULA PO)   Oral   Take 1 capsule by mouth daily.         Marland Kitchen PROAIR HFA 108 (90 BASE) MCG/ACT inhaler   Inhalation   Inhale 2 puffs into the lungs every 4 (four) hours as needed.            Dispense as written.   Marland Kitchen Propylene Glycol 0.6 % SOLN   Ophthalmic   Apply 1 drop to eye 2 (two) times daily.         . ranitidine (  ZANTAC) 300 MG tablet   Oral   Take 300 mg by mouth 2 (two) times daily.          Marland Kitchen. senna (SENOKOT) 8.6 MG TABS tablet   Oral   Take 2 tablets by mouth 2 (two) times daily.         . Skin Protectants, Misc. (DIMETHICONE-ZINC OXIDE) cream   Topical   Apply 1 application topically 3 (three) times daily as needed for dry skin.         . Skin Protectants, Misc. (MINERIN) CREA   Apply externally   Apply 1 application topically 2 (two) times daily.         . traMADol (ULTRAM) 50 MG tablet   Oral   Take 50 mg by mouth 3 (three) times daily as needed.         . vitamin B-12 (CYANOCOBALAMIN) 1000 MCG tablet   Oral   Take 1,000 mcg by mouth daily.         . calcitonin, salmon, (MIACALCIN/FORTICAL) 200 UNIT/ACT nasal spray   Alternating Nares   Place 1 spray into alternate nostrils daily.         . fluticasone (FLONASE) 50 MCG/ACT nasal spray   Each Nare   Place 2 sprays into both nostrils daily.         Marland Kitchen. loperamide (IMODIUM) 2 MG capsule   Oral   Take 2 mg by mouth as needed.          . ondansetron (ZOFRAN) 4 MG tablet    Oral   Take 4 mg by mouth every 8 (eight) hours as needed.            Allergies Sulfa antibiotics  History reviewed. No pertinent family history.  Social History History  Substance Use Topics  . Smoking status: Former Games developermoker  . Smokeless tobacco: Not on file  . Alcohol Use: No    Review of Systems  Constitutional: Negative for fever. Eyes: Negative for visual changes. ENT: Negative for sore throat. Cardiovascular: Negative for chest pain. Respiratory: Negative for shortness of breath. Gastrointestinal: Negative for abdominal pain, vomiting and diarrhea. Genitourinary: Negative for dysuria. Musculoskeletal: Negative for back pain. Skin: Negative for rash. Neurological: Negative for headaches, focal weakness or numbness.   10-point ROS otherwise negative.  ____________________________________________   PHYSICAL EXAM:  VITAL SIGNS: ED Triage Vitals  Enc Vitals Group     BP 11/10/14 1130 100/64 mmHg     Pulse Rate 11/10/14 1130 73     Resp --      Temp 11/10/14 1130 97.9 F (36.6 C)     Temp Source 11/10/14 1130 Oral     SpO2 11/10/14 1124 84 %     Weight 11/10/14 1130 112 lb (50.803 kg)     Height 11/10/14 1130 5\' 5"  (1.651 m)     Head Cir --      Peak Flow --      Pain Score --      Pain Loc --      Pain Edu? --      Excl. in GC? --      Constitutional: Alert and oriented. Well appearing and in no distress. Eyes: Conjunctivae are normal. PERRL. Normal extraocular movements. ENT   Head: Normocephalic and atraumatic.   Nose: No congestion/rhinnorhea.   Mouth/Throat: Mucous membranes are moist.   Neck: No stridor. Hematological/Lymphatic/Immunilogical: No cervical lymphadenopathy. Cardiovascular: Normal rate, regular rhythm. Normal and symmetric distal pulses are present in all extremities.  No murmurs, rubs, or gallops. Respiratory: Normal respiratory effort without tachypnea nor retractions. Breath sounds are clear and equal bilaterally.  No wheezes/rales/rhonchi. Gastrointestinal: Soft and nontender. No distention. No abdominal bruits. There is no CVA tenderness. Genitourinary: deferred Musculoskeletal: Nontender with normal range of motion in all extremities. No joint effusions.  No lower extremity tenderness nor edema. Neurologic:  Normal speech and language. No gross focal neurologic deficits are appreciated. Speech is normal. No gait instability. Skin:  Skin is warm, dry and intact. No rash noted. Psychiatric: Mood and affect are normal. Speech and behavior are normal. Patient exhibits appropriate insight and judgment.  ____________________________________________    LABS (pertinent positives/negatives)  Troponin elevated 0.05 but downtrending from 0.17 during most recent admission.  ____________________________________________   EKG  ED ECG REPORT   Date: 11/10/2014  EKG Time: 11:31  Rate: 74  Rhythm: Suspect sinus rhythm with sinus arrhythmia, interpretation limited by artifact  Axis: none  Intervals:none  ST&T Change: No acute ST elevation   ____________________________________________    RADIOLOGY   CXR: IMPRESSION: 1. No evidence of acute airspace disease. 2. Chronic elevation of the left hemidiaphragm with left basilar atelectasis. 3. At most trace residual right pleural effusion. 4. A large hiatal hernia.  Abdominal xray: Nonspecific bowel gas pattern, hiatal hernia  ____________________________________________   PROCEDURES  Procedure(s) performed: None  Critical Care performed: No  ____________________________________________   INITIAL IMPRESSION / ASSESSMENT AND PLAN / ED COURSE  Pertinent labs & imaging results that were available during my care of the patient were reviewed by me and considered in my medical decision making (see chart for details).  Rhona Raiderhomas F Worland is a 81 y.o. male with history of coronary artery disease, hypertension, hyperlipidemia, recent diagnosis of  pneumonia with inpatient treatment and discharge at the end of April 2016 presents from [facility] for generalized weakness, poor appetite for the past several days.  ----------------------------------------- 2:41 PM on 11/10/2014 -----------------------------------------  Patient with improvement in his blood pressure after normal saline. I suspect his generalized weakness is due to dehydration as he has had poor oral intake recently. Labs notable only for mild troponin elevation but this appears to be downtrending since his recent discharge. Mild leukocytosis but otherwise not meeting SIRS criteria. Currently he appears comfortable. Chest x-ray shows no acute airspace disease and a trace residual right pleural effusion. No bowel obstruction or perforation. Discussed with patient's family and they are requesting a palliative care consult as the patient frequently is reporting that he is "just ready to die" at [facility]. Will consult palliative care.   ----------------------------------------- 3:55 PM on 11/10/2014 -----------------------------------------  Dr. Clarice PolePfeifer at bedside. Care to Dr. Pershing ProudSchaevitz.  ____________________________________________   FINAL CLINICAL IMPRESSION(S) / ED DIAGNOSES  Final diagnoses:  Dehydration     Gayla DossEryka A Grasiela Jonsson, MD 11/10/14 [phone]

## 2014-11-10 NOTE — ED Notes (Signed)
Pt given food tray.

## 2014-11-10 NOTE — ED Notes (Signed)
Pt awaiting EMS transportation

## 2014-11-10 NOTE — ED Notes (Signed)
Pt given lunch tray.

## 2014-11-10 NOTE — ED Notes (Signed)
Pt arrives via EMS from [facility] with weakness and decreased eating, pt was diagnoised with penumonia last week and has been on Levaquin for 5 days, DNR

## 2014-11-10 NOTE — Discharge Instructions (Signed)
Aaron Erickson needs hospice services to be arranged on return to his facility. He should return immediately to the emergency department for any chest pain, trouble breathing, vomiting, fevers, blood in vomit or stool, abdominal pain, numbness, weakness, or for any other concerns  Dehydration, Adult Dehydration is when you lose more fluids from the body than you take in. Vital organs like the kidneys, brain, and heart cannot function without a proper amount of fluids and salt. Any loss of fluids from the body can cause dehydration.  CAUSES   Vomiting.  Diarrhea.  Excessive sweating.  Excessive urine output.  Fever. SYMPTOMS  Mild dehydration  Thirst.  Dry lips.  Slightly dry mouth. Moderate dehydration  Very dry mouth.  Sunken eyes.  Skin does not bounce back quickly when lightly pinched and released.  Dark urine and decreased urine production.  Decreased tear production.  Headache. Severe dehydration  Very dry mouth.  Extreme thirst.  Rapid, weak pulse (more than 100 beats per minute at rest).  Cold hands and feet.  Not able to sweat in spite of heat and temperature.  Rapid breathing.  Blue lips.  Confusion and lethargy.  Difficulty being awakened.  Minimal urine production.  No tears. DIAGNOSIS  Your caregiver will diagnose dehydration based on your symptoms and your exam. Blood and urine tests will help confirm the diagnosis. The diagnostic evaluation should also identify the cause of dehydration. TREATMENT  Treatment of mild or moderate dehydration can often be done at home by increasing the amount of fluids that you drink. It is best to drink small amounts of fluid more often. Drinking too much at one time can make vomiting worse. Refer to the home care instructions below. Severe dehydration needs to be treated at the hospital where you will probably be given intravenous (IV) fluids that contain water and electrolytes. HOME CARE INSTRUCTIONS   Ask  your caregiver about specific rehydration instructions.  Drink enough fluids to keep your urine clear or pale yellow.  Drink small amounts frequently if you have nausea and vomiting.  Eat as you normally do.  Avoid:  Foods or drinks high in sugar.  Carbonated drinks.  Juice.  Extremely hot or cold fluids.  Drinks with caffeine.  Fatty, greasy foods.  Alcohol.  Tobacco.  Overeating.  Gelatin desserts.  Wash your hands well to avoid spreading bacteria and viruses.  Only take over-the-counter or prescription medicines for pain, discomfort, or fever as directed by your caregiver.  Ask your caregiver if you should continue all prescribed and over-the-counter medicines.  Keep all follow-up appointments with your caregiver. SEEK MEDICAL CARE IF:  You have abdominal pain and it increases or stays in one area (localizes).  You have a rash, stiff neck, or severe headache.  You are irritable, sleepy, or difficult to awaken.  You are weak, dizzy, or extremely thirsty. SEEK IMMEDIATE MEDICAL CARE IF:   You are unable to keep fluids down or you get worse despite treatment.  You have frequent episodes of vomiting or diarrhea.  You have blood or green matter (bile) in your vomit.  You have blood in your stool or your stool looks black and tarry.  You have not urinated in 6 to 8 hours, or you have only urinated a small amount of very dark urine.  You have a fever.  You faint. MAKE SURE YOU:   Understand these instructions.  Will watch your condition.  Will get help right away if you are not doing well or get  worse. Document Released: 06/25/2005 Document Revised: 09/17/2011 Document Reviewed: 02/12/2011 [facility] Patient Information 2015 [employer] [place]. This information is not intended to replace advice given to you by your health care provider. Make sure you discuss any questions you have with your health care provider.

## 2014-11-10 NOTE — ED Provider Notes (Signed)
  Physical Exam  BP 87/59 mmHg  Pulse 73  Temp(Src) 97.9 F (36.6 C) (Oral)  Resp 20  Ht 5\' 5"  (1.651 m)  Wt 112 lb (50.803 kg)  BMI 18.64 kg/m2  SpO2 98%  Physical Exam Patient is resting comfortably at 4:33 PM. He is tolerating by mouth solids. His blood pressure is now 98 systolic over 63 diastolic. The patient will be discharged home. ED Course  Procedures Patient is DNR/DNI. Likely will receive hospice services in the near future. Hospice was involved in his care in the emergency department.       Arelia Longestavid M Labella Zahradnik, MD 11/10/14 [phone]

## 2014-11-10 NOTE — Consult Note (Signed)
Palliative Medicine Inpatient Consult Note   Name: Aaron Erickson Date: 11/10/2014 MRN: [id number]  DOB: 07/20/1920  Referring Physician: No att. providers found  Palliative Care consult requested for this 81 y.o. male for goals of medical therapy in patient with CAD, PVD, protein-calorie malnutrition (BMI 18.7, albumin 3.1), RA, BPH, GERD, HTN, hyperlipidemia. He was sent to the ER this AM for weakness and decreased po. Labs unremarkable. BP low but pt awake, oriented, answers questions approrpiately. Grandson at bedside. Marland Kitchen.    REVIEW OF SYSTEMS:  Pain: None Dyspnea:  No Nausea/Vomiting:  anorexia Diarrhea:  No Constipation:   No Depression:   No Anxiety:   No Fatigue:   Yes  SOCIAL HISTORY:  reports that he has quit smoking. He does not have any smokeless tobacco history on file. He reports that he does not drink alcohol.Pt is widowed. He has a daughter. Granddaughter is his HCPOA. He is a resident of [facility].   CODE STATUS: DNR  PAST MEDICAL HISTORY: Past Medical History  Diagnosis Date  . Hypertension   . Coronary artery disease   . GERD (gastroesophageal reflux disease)   . Chronic pain   . Peptic ulcer   . BPH (benign prostatic hyperplasia)   . Arthritis     PAST SURGICAL HISTORY:  Past Surgical History  Procedure Laterality Date  . Knee surgery    . Carotid endarterectomy      ALLERGIES:  is allergic to sulfa antibiotics.  MEDICATIONS:  No current facility-administered medications for this encounter.   Current Outpatient Prescriptions  Medication Sig Dispense Refill  . acetaminophen (TYLENOL) 500 MG tablet Take 1,000 mg by mouth 3 (three) times daily.    . Calcium Carbonate-Vitamin D (CALCIUM + D PO) Take 1 tablet by mouth 2 (two) times daily with a meal.    . cetirizine (ZYRTEC) 10 MG tablet Take 10 mg by mouth daily.    . clopidogrel (PLAVIX) 75 MG tablet Take 75 mg by mouth daily.    Marland Kitchen. docusate sodium (COLACE) 100 MG capsule Take 100 mg by  mouth 2 (two) times daily as needed for mild constipation.    . gabapentin (NEURONTIN) 100 MG capsule Take 200 mg by mouth daily.     Marland Kitchen. guaifenesin (ROBITUSSIN) 100 MG/5ML syrup Take 200 mg by mouth 4 (four) times daily as needed for cough.    . losartan (COZAAR) 25 MG tablet Take 25 mg by mouth daily.     . magnesium hydroxide (MILK OF MAGNESIA) 400 MG/5ML suspension Take 30 mLs by mouth daily as needed for mild constipation.    . Multiple Vitamins-Minerals (ICAPS AREDS FORMULA PO) Take 1 capsule by mouth daily.    Marland Kitchen. PROAIR HFA 108 (90 BASE) MCG/ACT inhaler Inhale 2 puffs into the lungs every 4 (four) hours as needed.     Marland Kitchen. Propylene Glycol 0.6 % SOLN Apply 1 drop to eye 2 (two) times daily.    . ranitidine (ZANTAC) 300 MG tablet Take 300 mg by mouth 2 (two) times daily.     Marland Kitchen. senna (SENOKOT) 8.6 MG TABS tablet Take 2 tablets by mouth 2 (two) times daily.    . Skin Protectants, Misc. (DIMETHICONE-ZINC OXIDE) cream Apply 1 application topically 3 (three) times daily as needed for dry skin.    . Skin Protectants, Misc. (MINERIN) CREA Apply 1 application topically 2 (two) times daily.    . traMADol (ULTRAM) 50 MG tablet Take 50 mg by mouth 3 (three) times daily as  needed.    . vitamin B-12 (CYANOCOBALAMIN) 1000 MCG tablet Take 1,000 mcg by mouth daily.    . calcitonin, salmon, (MIACALCIN/FORTICAL) 200 UNIT/ACT nasal spray Place 1 spray into alternate nostrils daily.    . fluticasone (FLONASE) 50 MCG/ACT nasal spray Place 2 sprays into both nostrils daily.    Marland Kitchen. loperamide (IMODIUM) 2 MG capsule Take 2 mg by mouth as needed.     . ondansetron (ZOFRAN) 4 MG tablet Take 4 mg by mouth every 8 (eight) hours as needed.       Vital Signs: BP 85/62 mmHg  Pulse 78  Temp(Src) 97.9 F (36.6 C) (Oral)  Resp 20  Ht 5\' 5"  (1.651 m)  Wt 50.803 kg (112 lb)  BMI 18.64 kg/m2  SpO2 96% Filed Weights   11/10/14 1130  Weight: 50.803 kg (112 lb)    Estimated body mass index is 18.64 kg/(m^2) as calculated  from the following:   Height as of this encounter: 5\' 5"  (1.651 m).   Weight as of this encounter: 50.803 kg (112 lb).  PERFORMANCE STATUS (ECOG) : 3 - Symptomatic, >50% confined to bed  PHYSICAL EXAM: General appearance: cachectic Head: Normocephalic, without obvious abnormality, atraumatic, OP clear, poor dentition Neck: supple, symmetrical, trachea midline Resp: fair air movement ant fields, no audible wheeze Cardio: regular rate and rhythm, S1, S2 normal, no murmur, click, rub or gallop GI: soft, non-tender; bowel sounds normal; no masses,  no organomegaly Extremities: no edema Neurologic: Grossly normal  LABS: CBC:  Recent Labs Lab 11/10/14 1140  WBC 12.3*  HGB 11.6*  HCT 36.0*  PLT 216   Comprehensive Metabolic Panel:  Recent Labs Lab 11/10/14 1140  NA 137  K 3.5  CL 100*  CO2 28  GLUCOSE 142*  BUN 30*  CREATININE 1.10  CALCIUM 8.8*  AST 19  ALT 11*  ALKPHOS 56  BILITOT 0.9    IMPRESSION: Mr Aaron Erickson is a 81 y.o.man with CAD, PVD, protein-calorie malnutrition (BMI 18.7, albumin 3.1), RA, BPH, GERD, HTN, hyperlipidemia. He was sent to the ER this AM for weakness and decreased po. Labs unremarkable. BP low but pt awake, oriented, answers questions approrpiately. Grandson at bedside.   Pt to return to ALF today. Pt does not want to return to ER/hospital and is in agreement with hospice following at the facility. Discussed with staff and ALF and they will initiate hospice. ER MD aware of plan.   Hospice Dx: protein calorie malnutrition (BMI 18.8, albumin 3.1)     PLAN: Hospice at [facility]  REFERRALS TO BE ORDERED:  Hospice   More than 50% of the visit was spent in counseling/coordination of care: Yes  Time spent: 70 minutes

## 2016-05-23 ENCOUNTER — Emergency Department: Payer: Medicare Other

## 2016-05-23 ENCOUNTER — Encounter: Payer: Self-pay | Admitting: Emergency Medicine

## 2016-05-23 ENCOUNTER — Emergency Department
Admission: EM | Admit: 2016-05-23 | Discharge: 2016-05-23 | Disposition: A | Discharge status: Home / Self Care | Payer: Medicare Other | Source: Home / Self Care | Attending: Emergency Medicine | Admitting: Emergency Medicine

## 2016-05-23 ENCOUNTER — Emergency Department
Admission: EM | Admit: 2016-05-23 | Discharge: 2016-05-24 | Disposition: A | Discharge status: Home / Self Care | Payer: Medicare Other | Attending: Emergency Medicine | Admitting: Emergency Medicine

## 2016-05-23 DIAGNOSIS — Y929 Unspecified place or not applicable: Secondary | ICD-10-CM | POA: Insufficient documentation

## 2016-05-23 DIAGNOSIS — I10 Essential (primary) hypertension: Secondary | ICD-10-CM | POA: Insufficient documentation

## 2016-05-23 DIAGNOSIS — Z79899 Other long term (current) drug therapy: Secondary | ICD-10-CM

## 2016-05-23 DIAGNOSIS — W1839XD Other fall on same level, subsequent encounter: Secondary | ICD-10-CM | POA: Diagnosis not present

## 2016-05-23 DIAGNOSIS — Z87891 Personal history of nicotine dependence: Secondary | ICD-10-CM | POA: Insufficient documentation

## 2016-05-23 DIAGNOSIS — S0990XA Unspecified injury of head, initial encounter: Secondary | ICD-10-CM

## 2016-05-23 DIAGNOSIS — Y939 Activity, unspecified: Secondary | ICD-10-CM

## 2016-05-23 DIAGNOSIS — I2510 Atherosclerotic heart disease of native coronary artery without angina pectoris: Secondary | ICD-10-CM

## 2016-05-23 DIAGNOSIS — F0390 Unspecified dementia without behavioral disturbance: Secondary | ICD-10-CM | POA: Insufficient documentation

## 2016-05-23 DIAGNOSIS — Y999 Unspecified external cause status: Secondary | ICD-10-CM | POA: Insufficient documentation

## 2016-05-23 DIAGNOSIS — R262 Difficulty in walking, not elsewhere classified: Secondary | ICD-10-CM

## 2016-05-23 DIAGNOSIS — Z791 Long term (current) use of non-steroidal anti-inflammatories (NSAID): Secondary | ICD-10-CM

## 2016-05-23 DIAGNOSIS — S0181XA Laceration without foreign body of other part of head, initial encounter: Secondary | ICD-10-CM | POA: Insufficient documentation

## 2016-05-23 DIAGNOSIS — W19XXXA Unspecified fall, initial encounter: Secondary | ICD-10-CM | POA: Insufficient documentation

## 2016-05-23 DIAGNOSIS — S060X0D Concussion without loss of consciousness, subsequent encounter: Secondary | ICD-10-CM | POA: Insufficient documentation

## 2016-05-23 DIAGNOSIS — S0101XA Laceration without foreign body of scalp, initial encounter: Secondary | ICD-10-CM

## 2016-05-23 DIAGNOSIS — R4182 Altered mental status, unspecified: Secondary | ICD-10-CM | POA: Diagnosis not present

## 2016-05-23 DIAGNOSIS — W19XXXD Unspecified fall, subsequent encounter: Secondary | ICD-10-CM

## 2016-05-23 LAB — URINALYSIS COMPLETE WITH MICROSCOPIC (ARMC ONLY)
BACTERIA UA: NONE SEEN
Bilirubin Urine: NEGATIVE
Glucose, UA: NEGATIVE mg/dL
Hgb urine dipstick: NEGATIVE
KETONES UR: NEGATIVE mg/dL
LEUKOCYTES UA: NEGATIVE
Nitrite: NEGATIVE
PH: 6 (ref 5.0–8.0)
PROTEIN: NEGATIVE mg/dL
Specific Gravity, Urine: 1.013 (ref 1.005–1.030)

## 2016-05-23 LAB — CBC
HCT: 37.5 % — ABNORMAL LOW (ref 40.0–52.0)
HEMOGLOBIN: 12.6 g/dL — AB (ref 13.0–18.0)
MCH: 32.2 pg (ref 26.0–34.0)
MCHC: 33.7 g/dL (ref 32.0–36.0)
MCV: 95.4 fL (ref 80.0–100.0)
PLATELETS: 227 10*3/uL (ref 150–440)
RBC: 3.93 MIL/uL — ABNORMAL LOW (ref 4.40–5.90)
RDW: 13.7 % (ref 11.5–14.5)
WBC: 9.8 10*3/uL (ref 3.8–10.6)

## 2016-05-23 LAB — GLUCOSE, CAPILLARY
GLUCOSE-CAPILLARY: 136 mg/dL — AB (ref 65–99)
GLUCOSE-CAPILLARY: 136 mg/dL — AB (ref 65–99)

## 2016-05-23 LAB — BASIC METABOLIC PANEL
Anion gap: 9 (ref 5–15)
BUN: 17 mg/dL (ref 6–20)
CHLORIDE: 102 mmol/L (ref 101–111)
CO2: 25 mmol/L (ref 22–32)
CREATININE: 0.89 mg/dL (ref 0.61–1.24)
Calcium: 9 mg/dL (ref 8.9–10.3)
GFR calc Af Amer: >60 mL/min (ref >60)
GFR calc non Af Amer: >60 mL/min (ref >60)
GLUCOSE: 113 mg/dL — AB (ref 65–99)
POTASSIUM: 4.1 mmol/L (ref 3.5–5.1)
SODIUM: 136 mmol/L (ref 135–145)

## 2016-05-23 LAB — TROPONIN I

## 2016-05-23 MED ORDER — LIDOCAINE-EPINEPHRINE (PF) 1 %-1:200000 IJ SOLN
INTRAMUSCULAR | Status: AC
  Administered 2016-05-23: 5 mL
  Filled 2016-05-23: qty 30

## 2016-05-23 MED ORDER — FLUTICASONE PROPIONATE 50 MCG/ACT NA SUSP: 2.0000 | Freq: Every day | NASAL | Status: DC

## 2016-05-23 MED ORDER — LOSARTAN POTASSIUM 50 MG PO TABS
25.0000 mg | ORAL_TABLET | Freq: Once | ORAL | Status: AC
  Administered 2016-05-23: 25 mg via ORAL
  Filled 2016-05-23: qty 2

## 2016-05-23 MED ORDER — GABAPENTIN 100 MG PO CAPS
200.0000 mg | ORAL_CAPSULE | Freq: Every day | ORAL | Status: DC
  Administered 2016-05-24: 200 mg via ORAL
  Filled 2016-05-23: qty 2

## 2016-05-23 MED ORDER — VITAMIN B-12 1000 MCG PO TABS: 1000.0000 ug | ORAL_TABLET | Freq: Every day | ORAL | Status: DC

## 2016-05-23 MED ORDER — PROPYLENE GLYCOL 0.6 % OP SOLN: 1.0000 [drp] | Freq: Two times a day (BID) | OPHTHALMIC | Status: DC

## 2016-05-23 MED ORDER — FAMOTIDINE 20 MG PO TABS
20.0000 mg | ORAL_TABLET | Freq: Every day | ORAL | Status: DC
  Administered 2016-05-24: 20 mg via ORAL
  Filled 2016-05-23: qty 1

## 2016-05-23 MED ORDER — MAGNESIUM HYDROXIDE 400 MG/5ML PO SUSP: 30.0000 mL | Freq: Every day | ORAL | Status: DC | PRN

## 2016-05-23 MED ORDER — LIDOCAINE-EPINEPHRINE (PF) 1 %-1:200000 IJ SOLN
5.0000 mL | Freq: Once | INTRAMUSCULAR | Status: AC
  Administered 2016-05-23: 5 mL

## 2016-05-23 MED ORDER — CALCITONIN (SALMON) 200 UNIT/ACT NA SOLN: 1.0000 | Freq: Every day | NASAL | Status: DC

## 2016-05-23 MED ORDER — METOPROLOL TARTRATE 5 MG/5ML IV SOLN
5.0000 mg | Freq: Once | INTRAVENOUS | Status: AC
  Administered 2016-05-23: 5 mg via INTRAVENOUS
  Filled 2016-05-23: qty 5

## 2016-05-23 MED ORDER — DOCUSATE SODIUM 100 MG PO CAPS: 100.0000 mg | ORAL_CAPSULE | Freq: Two times a day (BID) | ORAL | Status: DC | PRN

## 2016-05-23 MED ORDER — SENNA 8.6 MG PO TABS
2.0000 | ORAL_TABLET | Freq: Two times a day (BID) | ORAL | Status: DC
  Administered 2016-05-23: 17.2 mg via ORAL
  Filled 2016-05-23 (×2): qty 2

## 2016-05-23 MED ORDER — CLOPIDOGREL BISULFATE 75 MG PO TABS
75.0000 mg | ORAL_TABLET | Freq: Every day | ORAL | Status: DC
  Administered 2016-05-24: 75 mg via ORAL
  Filled 2016-05-23: qty 1

## 2016-05-23 MED ORDER — ACETAMINOPHEN 500 MG PO TABS
1000.0000 mg | ORAL_TABLET | Freq: Three times a day (TID) | ORAL | Status: DC
  Administered 2016-05-23 (×2): 1000 mg via ORAL
  Filled 2016-05-23 (×2): qty 2

## 2016-05-23 MED ORDER — LOSARTAN POTASSIUM 50 MG PO TABS
25.0000 mg | ORAL_TABLET | Freq: Every day | ORAL | Status: DC
  Administered 2016-05-24: 25 mg via ORAL
  Filled 2016-05-23: qty 1

## 2016-05-23 MED ORDER — POLYVINYL ALCOHOL 1.4 % OP SOLN
1.0000 [drp] | Freq: Two times a day (BID) | OPHTHALMIC | Status: DC
  Administered 2016-05-24: 1 [drp] via OPHTHALMIC
  Filled 2016-05-23: qty 15

## 2016-05-23 MED ORDER — ALBUTEROL SULFATE (2.5 MG/3ML) 0.083% IN NEBU: 3.0000 mL | INHALATION_SOLUTION | RESPIRATORY_TRACT | Status: DC | PRN

## 2016-05-23 MED ORDER — LIDOCAINE-EPINEPHRINE (PF) 2 %-1:200000 IJ SOLN: 5.0000 mL | Freq: Once | INTRAMUSCULAR | Status: DC

## 2016-05-23 NOTE — ED Provider Notes (Signed)
[facility] Emergency Department Provider Note   ____________________________________________   First MD Initiated Contact with Patient 05/23/16 [phone]     (approximate)  I have reviewed the triage vital signs and the nursing notes.   HISTORY  Chief Complaint Fall and Head Laceration  History is limited by patient's difficulty hearing as well as verbal history of dementia.  HPI Aaron Erickson is a 82 y.o. male who was found on the floor at his nursing home around 4:30 AM. According to EMS the patient has a history of dementia. The patient is also very hard of hearing. He is unable to tell me exactly what happened but reports that he doesn't think he passed out. The patient is also denying pain anywhere at this time. The patient has a large laceration to his forehead which is bleeding. EMS reports that they've tried to hold pressure but it just keeps bleeding through gauze   Past Medical History:  Diagnosis Date  . Arthritis   . BPH (benign prostatic hyperplasia)   . Chronic pain   . Coronary artery disease   . GERD (gastroesophageal reflux disease)   . Hypertension   . Peptic ulcer     There are no active problems to display for this patient.   Past Surgical History:  Procedure Laterality Date  . CAROTID ENDARTERECTOMY    . KNEE SURGERY      Prior to Admission medications   Medication Sig Start Date End Date Taking? Authorizing Provider  acetaminophen (TYLENOL) 500 MG tablet Take 1,000 mg by mouth 3 (three) times daily.    Historical Provider, MD  calcitonin, salmon, (MIACALCIN/FORTICAL) 200 UNIT/ACT nasal spray Place 1 spray into alternate nostrils daily. 11/04/14   Historical Provider, MD  Calcium Carbonate-Vitamin D (CALCIUM + D PO) Take 1 tablet by mouth 2 (two) times daily with a meal.    Historical Provider, MD  cetirizine (ZYRTEC) 10 MG tablet Take 10 mg by mouth daily.    Historical Provider, MD  clopidogrel (PLAVIX) 75 MG tablet Take  75 mg by mouth daily.    Historical Provider, MD  docusate sodium (COLACE) 100 MG capsule Take 100 mg by mouth 2 (two) times daily as needed for mild constipation.    Historical Provider, MD  fluticasone (FLONASE) 50 MCG/ACT nasal spray Place 2 sprays into both nostrils daily. 11/04/14   Historical Provider, MD  gabapentin (NEURONTIN) 100 MG capsule Take 200 mg by mouth daily.  11/07/14   Historical Provider, MD  guaifenesin (ROBITUSSIN) 100 MG/5ML syrup Take 200 mg by mouth 4 (four) times daily as needed for cough.    Historical Provider, MD  loperamide (IMODIUM) 2 MG capsule Take 2 mg by mouth as needed.  11/04/14   Historical Provider, MD  losartan (COZAAR) 25 MG tablet Take 25 mg by mouth daily.  11/07/14   Historical Provider, MD  magnesium hydroxide (MILK OF MAGNESIA) 400 MG/5ML suspension Take 30 mLs by mouth daily as needed for mild constipation.    Historical Provider, MD  Multiple Vitamins-Minerals (ICAPS AREDS FORMULA PO) Take 1 capsule by mouth daily.    Historical Provider, MD  ondansetron (ZOFRAN) 4 MG tablet Take 4 mg by mouth every 8 (eight) hours as needed.  11/04/14   Historical Provider, MD  PROAIR HFA 108 (90 BASE) MCG/ACT inhaler Inhale 2 puffs into the lungs every 4 (four) hours as needed.  11/04/14   Historical Provider, MD  Propylene Glycol 0.6 % SOLN Apply 1 drop to eye  2 (two) times daily.    Historical Provider, MD  ranitidine (ZANTAC) 300 MG tablet Take 300 mg by mouth 2 (two) times daily.  11/04/14   Historical Provider, MD  senna (SENOKOT) 8.6 MG TABS tablet Take 2 tablets by mouth 2 (two) times daily.    Historical Provider, MD  Skin Protectants, Misc. (DIMETHICONE-ZINC OXIDE) cream Apply 1 application topically 3 (three) times daily as needed for dry skin.    Historical Provider, MD  Skin Protectants, Misc. (MINERIN) CREA Apply 1 application topically 2 (two) times daily.    Historical Provider, MD  traMADol (ULTRAM) 50 MG tablet Take 50 mg by mouth 3 (three) times daily as  needed.    Historical Provider, MD  vitamin B-12 (CYANOCOBALAMIN) 1000 MCG tablet Take 1,000 mcg by mouth daily.    Historical Provider, MD    Allergies Sulfa antibiotics  History reviewed. No pertinent family history.  Social History Social History  Substance Use Topics  . Smoking status: Former Games developer  . Smokeless tobacco: Not on file  . Alcohol use No    Review of Systems Constitutional: No fever/chills Eyes: No visual changes. ENT: No sore throat. Cardiovascular: Denies chest pain. Respiratory: Denies shortness of breath. Gastrointestinal: No abdominal pain.  No nausea, no vomiting.  No diarrhea.  No constipation. Genitourinary: Negative for dysuria. Musculoskeletal: Negative for back pain. Skin: Laceration Neurological: Negative for headaches, focal weakness or numbness.  10-point ROS otherwise negative.  ____________________________________________   PHYSICAL EXAM:  VITAL SIGNS: ED Triage Vitals  Enc Vitals Group     BP 05/23/16 0542 (!) 208/105     Pulse Rate 05/23/16 0542 95     Resp 05/23/16 0542 19     Temp 05/23/16 0542 (!) 96.9 F (36.1 C)     Temp Source 05/23/16 0542 Axillary     SpO2 05/23/16 0542 96 %     Weight 05/23/16 0548 95 lb (43.1 kg)     Height --      Head Circumference --      Peak Flow --      Pain Score --      Pain Loc --      Pain Edu? --      Excl. in GC? --     Constitutional: Somnolent but arousable. Cachectic appearing and in moderate distress. Eyes: Conjunctivae are normal. PERRL. EOMI. Head: Bleeding laceration to forehead Nose: No congestion/rhinnorhea. Mouth/Throat: Mucous membranes are dry.  Mouth filled with dried blood Neck: No cervical spine tenderness to palpation. Cardiovascular: Normal rate, irregular rhythm. Systolic murmur  Good peripheral circulation. Respiratory: Normal respiratory effort.  No retractions. Lungs CTAB. Gastrointestinal: Soft and nontender. No distention.  Musculoskeletal: No lower  extremity tenderness nor edema.  No joint effusions. Neurologic:  Normal speech and language. No gross focal neurologic deficits are appreciated. No gait instability. Skin:  7-8 cm laceration to forehead that is actively bleeding. Psychiatric: Mood and affect are normal. Speech and behavior are normal.  ____________________________________________   LABS (all labs ordered are listed, but only abnormal results are displayed)  Labs Reviewed  CBC - Abnormal; Notable for the following:       Result Value   RBC 3.93 (*)    Hemoglobin 12.6 (*)    HCT 37.5 (*)    All other components within normal limits  BASIC METABOLIC PANEL - Abnormal; Notable for the following:    Glucose, Bld 113 (*)    All other components within normal limits  TROPONIN I  URINALYSIS COMPLETEWITH MICROSCOPIC ([facility] ONLY)   ____________________________________________  EKG  ED ECG REPORT I, Rebecka Apley, the attending physician, personally viewed and interpreted this ECG.   Date: 05/23/2016  EKG Time: 715  Rate: 95  Rhythm: normal sinus rhythm  Axis: normal  Intervals:none  ST&T Change: none  ____________________________________________  RADIOLOGY  CT head, cervical spine and maxilofacial ____________________________________________   PROCEDURES  Procedure(s) performed: please, see procedure note(s).  Marland Kitchen.Laceration Repair Date/Time: 05/23/2016 6:00 AM Performed by: Rebecka Apley Authorized by: Rebecka Apley   Consent:    Consent obtained:  Emergent situation Anesthesia (see MAR for exact dosages):    Anesthesia method:  Local infiltration   Local anesthetic:  Lidocaine 1% WITH epi Laceration details:    Location:  Face   Face location:  Forehead   Length (cm):  7 Repair type:    Repair type:  Intermediate Pre-procedure details:    Preparation:  Patient was prepped and draped in usual sterile fashion Exploration:    Hemostasis achieved with:  Epinephrine, direct  pressure and tied off vessels   Contaminated: no   Treatment:    Area cleansed with:  Saline, Hibiclens and soap and water   Amount of cleaning:  Standard   Irrigation solution:  Sterile saline Subcutaneous repair:    Suture size:  5-0   Suture technique:  Figure eight   Number of sutures:  2 Skin repair:    Repair method:  Sutures   Suture size:  5-0   Suture material:  Prolene   Suture technique:  Simple interrupted   Number of sutures:  7 Approximation:    Approximation:  Close   Vermilion border: well-aligned   Post-procedure details:    Dressing:  Antibiotic ointment   Patient tolerance of procedure:  Tolerated well, no immediate complications    Critical Care performed: No  ____________________________________________   INITIAL IMPRESSION / ASSESSMENT AND PLAN / ED COURSE  Pertinent labs & imaging results that were available during my care of the patient were reviewed by me and considered in my medical decision making (see chart for details).  This is a 82 year old male who comes into the hospital today after being found down at his nursing home. This was an unwitnessed fall. The patient had a significant laceration which I repaired when he initially arrived. Although EMS reports that he has a history of dementia it is said that he has some mild memory impairment on his paperwork. I will check some blood work as his blood pressure is elevated and he has some mild tachycardia. He will receive a CT of his head, cervical spine and maxillofacial. I will give the patient a dose of losartan and Lopressor for his blood pressure.  Clinical Course as of May 23 726  Wed May 23, 2016  1610 Temp: (!) 96.9 F (36.1 C) [JW]  0700 Temp: (!) 96.9 F (36.1 C) [JW]  0700 Temp: (!) 96.9 F (36.1 C) [JW]    Clinical Course User Index [JW] Emily Filbert, MD    The patient's care will be signed out to Dr. Mayford Knife who will follow-up the results of the imaging and the blood  work. ____________________________________________   FINAL CLINICAL IMPRESSION(S) / ED DIAGNOSES  Final diagnoses:  Fall      NEW MEDICATIONS STARTED DURING THIS VISIT:  New Prescriptions   No medications on file     Note:  This document was prepared using Dragon voice recognition software and may include  unintentional dictation errors.    Rebecka ApleyAllison P Webster, MD 05/23/16 [phone]

## 2016-05-23 NOTE — ED Triage Notes (Signed)
Pt arrived via EMS from [facility]. Pt had an unwitnessed fall and was found on the floor. Pt has laceration to the left forehead that is actively bleeding. Pt does take Plavix. Pt is a poor historian and is unsure if he lost conciousness

## 2016-05-23 NOTE — ED Triage Notes (Addendum)
Pt to ED via EMS, pt was seen here xfew hours ago by MD Mayford KnifeWilliams for fall and received stiches to forehead. Facility states he came back and was AMS with slurred speech.

## 2016-05-23 NOTE — ED Provider Notes (Signed)
Patient is in no distress, [facility] who feels like the patient does not have a fracture, that this is likely artifactual. Furthermore the patient would not tolerate a cervical collar. He is stable for outpatient follow-up.   Emily FilbertJonathan E Mahir Prabhakar, MD 05/23/16 [phone]

## 2016-05-23 NOTE — ED Notes (Signed)
Pt rearranged in bed, pt assisted to urinate, pt resting in bed in no acute distress at this time, will continue to monitor

## 2016-05-23 NOTE — ED Provider Notes (Signed)
[facility] Emergency Department Provider Note  ____________________________________________  Time seen: Approximately 3:42 PM  I have reviewed the triage vital signs and the nursing notes.   HISTORY  Chief Complaint Altered Mental Status  Level 5 caveat:  Portions of the history and physical were unable to be obtained due to dementia   HPI Aaron Erickson is a 82 years old. male with a history of dementia, arthritis, CAD, hypertension who presents for evaluation of altered mental status. Patient was seen here this morning s/p fall at SNF. Had imaging of head and neck with no acute findings. Blood work and UA all negative and patient was sent back to SNF. Patient returns now because SNF says patient is more confused than normal and his speech is more slurred than normal. Patient unable to provide any history which was patient's baseline when he came to the ER this morning per review of Epic note from Dr. Zenda AlpersWebster, Dr. Mayford KnifeWilliams and also per RN staff who cared for patient earlier today.    Past Medical History:  Diagnosis Date  . Arthritis   . BPH (benign prostatic hyperplasia)   . Chronic pain   . Coronary artery disease   . GERD (gastroesophageal reflux disease)   . Hypertension   . Peptic ulcer     There are no active problems to display for this patient.   Past Surgical History:  Procedure Laterality Date  . CAROTID ENDARTERECTOMY    . KNEE SURGERY      Prior to Admission medications   Medication Sig Start Date End Date Taking? Authorizing Provider  acetaminophen (TYLENOL) 500 MG tablet Take 1,000 mg by mouth 3 (three) times daily.    Historical Provider, MD  calcitonin, salmon, (MIACALCIN/FORTICAL) 200 UNIT/ACT nasal spray Place 1 spray into alternate nostrils daily. 11/04/14   Historical Provider, MD  Calcium Carbonate-Vitamin D (CALCIUM + D PO) Take 1 tablet by mouth 2 (two) times daily with a meal.    Historical Provider, MD  cetirizine (ZYRTEC) 10  MG tablet Take 10 mg by mouth daily.    Historical Provider, MD  clopidogrel (PLAVIX) 75 MG tablet Take 75 mg by mouth daily.    Historical Provider, MD  docusate sodium (COLACE) 100 MG capsule Take 100 mg by mouth 2 (two) times daily as needed for mild constipation.    Historical Provider, MD  fluticasone (FLONASE) 50 MCG/ACT nasal spray Place 2 sprays into both nostrils daily. 11/04/14   Historical Provider, MD  gabapentin (NEURONTIN) 100 MG capsule Take 200 mg by mouth daily.  11/07/14   Historical Provider, MD  guaifenesin (ROBITUSSIN) 100 MG/5ML syrup Take 200 mg by mouth 4 (four) times daily as needed for cough.    Historical Provider, MD  loperamide (IMODIUM) 2 MG capsule Take 2 mg by mouth as needed.  11/04/14   Historical Provider, MD  losartan (COZAAR) 25 MG tablet Take 25 mg by mouth daily.  11/07/14   Historical Provider, MD  magnesium hydroxide (MILK OF MAGNESIA) 400 MG/5ML suspension Take 30 mLs by mouth daily as needed for mild constipation.    Historical Provider, MD  Multiple Vitamins-Minerals (ICAPS AREDS FORMULA PO) Take 1 capsule by mouth daily.    Historical Provider, MD  ondansetron (ZOFRAN) 4 MG tablet Take 4 mg by mouth every 8 (eight) hours as needed.  11/04/14   Historical Provider, MD  PROAIR HFA 108 (90 BASE) MCG/ACT inhaler Inhale 2 puffs into the lungs every 4 (four) hours as needed.  11/04/14   Historical Provider, MD  Propylene Glycol 0.6 % SOLN Apply 1 drop to eye 2 (two) times daily.    Historical Provider, MD  ranitidine (ZANTAC) 300 MG tablet Take 300 mg by mouth 2 (two) times daily.  11/04/14   Historical Provider, MD  senna (SENOKOT) 8.6 MG TABS tablet Take 2 tablets by mouth 2 (two) times daily.    Historical Provider, MD  Skin Protectants, Misc. (DIMETHICONE-ZINC OXIDE) cream Apply 1 application topically 3 (three) times daily as needed for dry skin.    Historical Provider, MD  Skin Protectants, Misc. (MINERIN) CREA Apply 1 application topically 2 (two) times daily.     Historical Provider, MD  traMADol (ULTRAM) 50 MG tablet Take 50 mg by mouth 3 (three) times daily as needed.    Historical Provider, MD  vitamin B-12 (CYANOCOBALAMIN) 1000 MCG tablet Take 1,000 mcg by mouth daily.    Historical Provider, MD    Allergies Sulfa antibiotics  No family history on file.  Social History Social History  Substance Use Topics  . Smoking status: Former Games developermoker  . Smokeless tobacco: Not on file  . Alcohol use No    Review of Systems + confusion and slurred speech Unable to obtain remaining of ROS ____________________________________________   PHYSICAL EXAM:  VITAL SIGNS: ED Triage Vitals  Enc Vitals Group     BP 05/23/16 1432 (!) 159/97     Pulse Rate 05/23/16 1432 88     Resp 05/23/16 1432 16     Temp 05/23/16 1432 97.9 F (36.6 C)     Temp Source 05/23/16 1428 Oral     SpO2 05/23/16 1432 96 %     Weight --      Height --      Head Circumference --      Peak Flow --      Pain Score --      Pain Loc --      Pain Edu? --      Excl. in GC? --     Constitutional: Awake, will look at me and smile, does not answer to questions. No distress HEENT:      Head: Large forehead hematoma with stitches from this morning on forehead lac.         Eyes: Conjunctivae are normal. Sclera is non-icteric. EOMI. PERRL      Mouth/Throat: Mucous membranes are dry.      Ear: No hemotympanum or Battle sign       Neck: No midline c-spine ttp Cardiovascular: Regular rate and rhythm. No murmurs, gallops, or rubs. 2+ symmetrical distal pulses are present in all extremities. No JVD. Respiratory: Normal respiratory effort. Lungs are clear to auscultation bilaterally. No wheezes, crackles, or rhonchi.  Gastrointestinal: Soft, non tender, and non distended with positive bowel sounds. No rebound or guarding. Musculoskeletal: No edema, cyanosis, or erythema of extremities. Neurologic: Slurred speech, does not follow commands, moving b/l UE. Skin: Skin is warm, dry and  intact. No rash noted.  ____________________________________________   LABS (all labs ordered are listed, but only abnormal results are displayed)  Labs Reviewed  GLUCOSE, CAPILLARY - Abnormal; Notable for the following:       Result Value   Glucose-Capillary 136 (*)    All other components within normal limits  GLUCOSE, CAPILLARY - Abnormal; Notable for the following:    Glucose-Capillary 136 (*)    All other components within normal limits  CBG MONITORING, ED   ____________________________________________  EKG  ED  ECG REPORT I, Nita Sickle, the attending physician, personally viewed and interpreted this ECG. Normal sinus rhythm, rate of 89, normal intervals, normal axis, no ST elevations or depressions.  ____________________________________________  RADIOLOGY  Head CT: Motion degraded images.  Soft tissue swelling overlying the frontal bone, right greater than left. No evidence of calvarial fracture.  No evidence of acute intracranial abnormality.  Atrophy with small vessel ischemic changes. ____________________________________________   PROCEDURES  Procedure(s) performed: None Procedures Critical Care performed:  None ____________________________________________   INITIAL IMPRESSION / ASSESSMENT AND PLAN / ED COURSE   82 y.o. male with a history of dementia, arthritis, CAD, hypertension who presents for evaluation of altered mental status since sustained a fall earlier today. Patient was seen here this morning with essentially normal labs and CT face, head, cervical spine with no acute findings. Patient was discharged back to his facility and sent back to the emergency room for slurred speech and altered mental status. Per review of Epic notes from Dr. Zenda Alpers, DR. Williams and RN staff who cared for patient this morning, his mental status has been identical since this morning. Patient will not follow commands but is moving bilateral upper extremities,  face is symmetric, pupils are equal round and reactive, and extraocular movements are within normal limits. Patient has a very large forehead hematoma with a laceration that was repaired earlier today. No hemotympanum. Since patient is on Plavix, head CT was repeated to rule out delayed bleed which is negative. EKG with no evidence of ischemia. Blood glucose within normal limits.  Presentation concerning for concussion. I spoke with patient's daughter, Ms. Aaron Erickson about patient's findings and evaluation in the ED. She agrees with sending patient back to SNF and she is on her way there now to see him. Will dc back home now.  Clinical Course     Pertinent labs & imaging results that were available during my care of the patient were reviewed by me and considered in my medical decision making (see chart for details).    ____________________________________________   FINAL CLINICAL IMPRESSION(S) / ED DIAGNOSES  Final diagnoses:  Fall, subsequent encounter  Altered mental status, unspecified altered mental status type  Concussion without loss of consciousness, subsequent encounter      NEW MEDICATIONS STARTED DURING THIS VISIT:  New Prescriptions   No medications on file     Note:  This document was prepared using Dragon voice recognition software and may include unintentional dictation errors.    Nita Sickle, MD 05/23/16 [phone]

## 2016-05-23 NOTE — ED Notes (Signed)
EMS at bedside for transport to [facility]

## 2016-05-23 NOTE — ED Notes (Signed)
Pt unable to feed self, will not drink from straw, lends to right, wont hold head up,  Pt is hospice with DNR.

## 2016-05-23 NOTE — ED Notes (Addendum)
Pt asks for urinal. Dtr at the bedside. Bed alarm in place. Hopsital bed in place. Sutures in place to forehead no signs of infection. Large bruising to right eye and side of face. Pt has various brusies to arms and legs. Pt is pleasantly confused. Pt has trouble walking and weak. Pt not drinking or eating when offered.

## 2016-05-23 NOTE — Discharge Instructions (Addendum)
We recommend continued work with palliative care as an outpatient.

## 2016-05-23 NOTE — ED Notes (Signed)
Pt continues to remove c-collar, EDP made aware, no c-collar on pt at this time

## 2016-05-23 NOTE — ED Notes (Addendum)
Report given to [facility], [place] to be [place] via EMS

## 2016-05-23 NOTE — ED Notes (Signed)
Pt placed in c-collar

## 2016-05-23 NOTE — ED Notes (Signed)
EMS called for pt discharge. 

## 2016-05-24 MED ORDER — NYSTATIN 100000 UNIT/GM EX POWD: Freq: Three times a day (TID) | CUTANEOUS | Status: DC

## 2016-05-24 NOTE — ED Notes (Signed)
Pt given food tray, daughter at bedside helping pt eat

## 2016-05-24 NOTE — Progress Notes (Signed)
Per [employer] admissions coordinator at [facility] they can accept patient today under rehab and patient will have to revoke hospice services. Clinical Social Worker (CSW) contacted patient's granddaughter Idalia Needleaige and made her aware of above. Per Idalia NeedlePaige she will sign revocation form and is agreeable for patient to go to [facility]. Per [place] patient will go to room 32-A. RN will call report and arrange EMS for transport. [facility]. Patient's daughter is agreeable for palliative care to follow at facility. [facility] lia[facility] is aware of above. Please reconsult if future social work needs arise. CSW signing off.   [employer] Xanthe Couillard, LCSW [phone]

## 2016-05-24 NOTE — Clinical Social Work Placement (Signed)
   CLINICAL SOCIAL WORK PLACEMENT  NOTE  Date:  05/24/2016  Patient Details  Name: Aaron Erickson F Bassinger MRN: [id number] Date of Birth: 09/21/1920  Clinical Social Work is seeking post-discharge placement for this patient at the Skilled  Nursing Facility level of care (*CSW will initial, date and re-position this form in  chart as items are completed):  Yes   Patient/family provided with [facility] Clinical Social Work Department's list of facilities offering this level of care within the geographic area requested by the patient (or if unable, by the patient's family).  Yes   Patient/family informed of their freedom to choose among providers that offer the needed level of care, that participate in Medicare, Medicaid or managed care program needed by the patient, have an available bed and are willing to accept the patient.  Yes   Patient/family informed of [facility]'s ownership interest in [facility] and [facility], as well as of the fact that they are under no obligation to receive care at these facilities.  PASRR submitted to EDS on       PASRR number received on       Existing PASRR number confirmed on 05/24/16     FL2 transmitted to all facilities in geographic area requested by pt/family on 05/24/16     FL2 transmitted to all facilities within larger geographic area on       Patient informed that his/her managed care company has contracts with or will negotiate with certain facilities, including the following:        Yes   Patient/family informed of bed offers received.  Patient chooses bed at  [facility] )     Physician recommends and patient chooses bed at      Patient to be transferred to  [facility] ) on 05/24/16.  Patient to be transferred to facility by  [facility] )     Patient family notified on 05/24/16 of transfer.  Name of family member notified:   (Patient's granddaughter Idalia Needleaige is aware of D/C today. )     PHYSICIAN        Additional Comment:    _______________________________________________ Velvet Moomaw, Darleen CrockerBailey M, LCSW 05/24/2016, 11:34 AM

## 2016-05-24 NOTE — NC FL2 (Signed)
[place] MEDICAID FL2 LEVEL OF CARE SCREENING TOOL     IDENTIFICATION  Patient Name: Aaron Erickson Birthdate: 06/10/1921 Sex: male Admission Date (Current Location): 05/23/2016  County and Medicaid Number:  [place]   Facility and Address:  [facility], [address], [place], [place] [address]      Provider Number: [id number]  Attending Physician Name and Address:  No att. providers found  Relative Name and Phone Number:       Current Level of Care: Hospital Recommended Level of Care: Skilled Nursing Facility Prior Approval Number:    Date Approved/Denied:   PASRR Number:  ([id number] )  Discharge Plan: SNF    Current Diagnoses:  . Arthritis   . BPH (benign prostatic hyperplasia)   . Chronic pain   . Coronary artery disease   . GERD (gastroesophageal reflux disease)   . Hypertension   . Peptic ulcer      Orientation RESPIRATION BLADDER Height & Weight      (Disoriented X4)  Normal Incontinent Weight:   Height:     BEHAVIORAL SYMPTOMS/MOOD NEUROLOGICAL BOWEL NUTRITION STATUS   (none)  (none ) Continent Diet (Diet: DYS 3 )  AMBULATORY STATUS COMMUNICATION OF NEEDS Skin   Total Care Verbally Normal                       Personal Care Assistance Level of Assistance  Bathing, Feeding, Dressing Bathing Assistance: Maximum assistance Feeding assistance: Maximum assistance Dressing Assistance: Maximum assistance     Functional Limitations Info  Sight, Hearing, Speech Sight Info: Adequate Hearing Info: Adequate Speech Info: Adequate    SPECIAL CARE FACTORS FREQUENCY  PT (By licensed PT), OT (By licensed OT)     PT Frequency:  (5) OT Frequency:  (5)            Contractures      Additional Factors Info  Code Status, Allergies Code Status Info:  (Not on file. ) Allergies Info:  (Sulfa Antibiotics)           Current Medications (05/24/2016):  This is the current hospital active medication  list Current Facility-Administered Medications  Medication Dose Route Frequency Provider Last Rate Last Dose  . acetaminophen (TYLENOL) tablet 1,000 mg  1,000 mg Oral TID [place] Veronese, MD   1,000 mg at 05/23/16 2332  . albuterol (PROVENTIL) (2.5 MG/3ML) 0.083% nebulizer solution 3 mL  3 mL Inhalation Q4H PRN [place] Veronese, MD      . calcitonin (salmon) (MIACALCIN/FORTICAL) nasal spray 1 spray  1 spray Alternating Nares Daily [place] Veronese, MD      . clopidogrel (PLAVIX) tablet 75 mg  75 mg Oral Daily [place] Veronese, MD      . docusate sodium (COLACE) capsule 100 mg  100 mg Oral BID PRN [place] Veronese, MD      . famotidine (PEPCID) tablet 20 mg  20 mg Oral Daily [place] Veronese, MD      . fluticasone (FLONASE) 50 MCG/ACT nasal spray 2 spray  2 spray Each Nare Daily [place] Veronese, MD      . gabapentin (NEURONTIN) capsule 200 mg  200 mg Oral Daily [place] Veronese, MD      . losartan (COZAAR) tablet 25 mg  25 mg Oral Daily [place] Veronese, MD      . magnesium hydroxide (MILK OF MAGNESIA) suspension 30 mL  30 mL Oral Daily PRN  Veronese, MD      . nystatin (MYCOSTATIN/NYSTOP) topical powder     Topical TID Darci Currentandolph N Brown, MD      . polyvinyl alcohol (LIQUIFILM TEARS) 1.4 % ophthalmic solution 1 drop  1 drop Both Eyes BID Nita Sicklearolina Veronese, MD   1 drop at 05/24/16 0010  . senna (SENOKOT) tablet 17.2 mg  2 tablet Oral BID Nita Sicklearolina Veronese, MD   17.2 mg at 05/23/16 2329  . vitamin B-12 (CYANOCOBALAMIN) tablet 1,000 mcg  1,000 mcg Oral Daily Nita Sicklearolina Veronese, MD       Current Outpatient Prescriptions  Medication Sig Dispense Refill  . acetaminophen (TYLENOL) 500 MG tablet Take 1,000 mg by mouth 3 (three) times daily.    . calcitonin, salmon, (MIACALCIN/FORTICAL) 200 UNIT/ACT nasal spray Place 1 spray into alternate nostrils daily.    . cetirizine (ZYRTEC) 10 MG tablet Take 10 mg by mouth daily.    . clopidogrel (PLAVIX) 75 MG tablet Take 75 mg by mouth daily.     Marland Kitchen. docusate sodium (COLACE) 100 MG capsule Take 100 mg by mouth 2 (two) times daily as needed for mild constipation.    . fluticasone (FLONASE) 50 MCG/ACT nasal spray Place 2 sprays into both nostrils daily.    Marland Kitchen. gabapentin (NEURONTIN) 100 MG capsule Take 200 mg by mouth daily.     Marland Kitchen. losartan (COZAAR) 25 MG tablet Take 25 mg by mouth daily.     . magnesium hydroxide (MILK OF MAGNESIA) 400 MG/5ML suspension Take 30 mLs by mouth daily as needed for mild constipation.    Marland Kitchen. Propylene Glycol 0.6 % SOLN Apply 1 drop to eye 2 (two) times daily.    . ranitidine (ZANTAC) 300 MG tablet Take 300 mg by mouth 2 (two) times daily.     Marland Kitchen. scopolamine (TRANSDERM-SCOP, 1.5 MG,) 1 MG/3DAYS Place 1 patch onto the skin every 3 (three) days. Place behind ear.    . senna (SENOKOT) 8.6 MG TABS tablet Take 2 tablets by mouth 2 (two) times daily.    . vitamin B-12 (CYANOCOBALAMIN) 1000 MCG tablet Take 1,000 mcg by mouth daily.    . Calcium Carbonate-Vitamin D (CALCIUM + D PO) Take 1 tablet by mouth 2 (two) times daily with a meal.    . guaifenesin (ROBITUSSIN) 100 MG/5ML syrup Take 200 mg by mouth 4 (four) times daily as needed for cough.    . loperamide (IMODIUM) 2 MG capsule Take 2 mg by mouth as needed.     . Multiple Vitamins-Minerals (ICAPS AREDS FORMULA PO) Take 1 capsule by mouth daily.    . ondansetron (ZOFRAN) 4 MG tablet Take 4 mg by mouth every 8 (eight) hours as needed.     Marland Kitchen. PROAIR HFA 108 (90 BASE) MCG/ACT inhaler Inhale 2 puffs into the lungs every 4 (four) hours as needed.     . Skin Protectants, Misc. (DIMETHICONE-ZINC OXIDE) cream Apply 1 application topically 3 (three) times daily as needed for dry skin.    . Skin Protectants, Misc. (MINERIN) CREA Apply 1 application topically 2 (two) times daily.       Discharge Medications: Please see discharge summary for a list of discharge medications.  Relevant Imaging Results:  Relevant Lab Results:   Additional Information  (SSN:  [id number])  Nari Vannatter, Darleen CrockerBailey M, LCSW

## 2016-05-24 NOTE — Progress Notes (Signed)
New referral for Palliative services to follow at [facility] received from CSW [facility]. Referral made aware. Patient to discharge today from the ED. Thank you. Dayna BarkerKaren Robertson RN, BSN, [facility] [place], [facility] Liaison [phone] c

## 2016-05-24 NOTE — Evaluation (Signed)
Physical Therapy Evaluation Patient Details Name: Aaron Erickson MRN: [id number] DOB: 09/24/1920 Today's Date: 05/24/2016   History of Present Illness  82 y/o male here after a fall at ALF.  He had some confusion, went back and apparently returns with continued altered mental status.    Clinical Impression  Pt is confused and communication was a struggle for the entirety of PT session.  He did show some effort in getting to sitting EOB and to standing but ultimately showed poor balance, poor safety awareness and functionally was very limited.  He was unable to attempt any ambulation and was not safe with standing activities needing considerable assist just to stay upright.     Follow Up Recommendations SNF (unsure of PLOF but currently he's quite functionally limited)    Equipment Recommendations       Recommendations for Other Services       Precautions / Restrictions Precautions Precautions: Fall Restrictions Weight Bearing Restrictions: No      Mobility  Bed Mobility Overal bed mobility: Needs Assistance Bed Mobility: Supine to Sit;Sit to Supine     Supine to sit: Mod assist Sit to supine: Mod assist   General bed mobility comments: Pt shows effort in getting to EOB sitting, but is impulsive, does not follow instruictions well and once up is off balance and leaning (mostly back and to the L)  Transfers Overall transfer level: Needs assistance Equipment used: Rolling walker (2 wheeled) Transfers: Sit to/from Stand Sit to Stand: Mod assist         General transfer comment: Pt was unable to get to standing w/o direct assist.  He was unsafe and ultimately needed direct assist just to remain upright with poor awareness, unability to use walker appropriately to take weight /get upright and generally confused and unsafe  Ambulation/Gait             General Gait Details: Pt unable to do any ambulation.  We attempted very limited side stepping at EOB and he was  unable to do this  Stairs            Wheelchair Mobility    Modified Rankin (Stroke Patients Only)       Balance Overall balance assessment: Needs assistance   Sitting balance-Leahy Scale: Poor       Standing balance-Leahy Scale: Poor                               Pertinent Vitals/Pain Pain Assessment:  (unable to rate, did not seem pain limited with activity)    Home Living                   Additional Comments: apparently he is from assisted living, will likely need STR at this time    Prior Function           Comments: Pt unable to report PLOF secondary to confusion and severely slurred speech     Hand Dominance        Extremity/Trunk Assessment   Upper Extremity Assessment: Difficult to assess due to impaired cognition (Pt appeared to have grossly 3/5 pain)           Lower Extremity Assessment: Difficult to assess due to impaired cognition (does not follow instruction, appears to have ~3/5 strength)         Communication   Communication: Expressive difficulties;HOH (Pt confused, difficult to transfer information)  Cognition Arousal/Alertness: Lethargic Behavior  During Therapy: Anxious Overall Cognitive Status: Difficult to assess                      General Comments      Exercises     Assessment/Plan    PT Assessment Patient needs continued PT services  PT Problem List Decreased strength;Decreased range of motion;Decreased activity tolerance;Decreased balance;Decreased mobility;Decreased cognition;Decreased coordination;Decreased knowledge of use of DME;Decreased safety awareness          PT Treatment Interventions DME instruction;Gait training;Stair training;Functional mobility training;Therapeutic activities;Therapeutic exercise;Balance training;Cognitive remediation;Neuromuscular re-education;Patient/family education    PT Goals (Current goals can be found in the Care Plan section)  Acute Rehab PT  Goals Patient Stated Goal: unable to state PT Goal Formulation: Patient unable to participate in goal setting Time For Goal Achievement: 06/07/16 Potential to Achieve Goals: Fair    Frequency Min 2X/week   Barriers to discharge        Co-evaluation               End of Session Equipment Utilized During Treatment: Gait belt Activity Tolerance:  (confusion) Patient left: with bed alarm set;with call bell/phone within reach      Functional Assessment Tool Used: clinical judgement Functional Limitation: Mobility: Walking and moving around Mobility: Walking and Moving Around Current Status [phone](G8978): At least 80 percent but less than 100 percent impaired, limited or restricted Mobility: Walking and Moving Around Goal Status [phone](G8979): At least 20 percent but less than 40 percent impaired, limited or restricted    Time: 0826-0842 PT Time Calculation (min) (ACUTE ONLY): 16 min   Charges:   PT Evaluation $PT Eval Low Complexity: 1 Procedure     PT G Codes:   PT G-Codes **NOT FOR INPATIENT CLASS** Functional Assessment Tool Used: clinical judgement Functional Limitation: Mobility: Walking and moving around Mobility: Walking and Moving Around Current Status (U9811(G8978): At least 80 percent but less than 100 percent impaired, limited or restricted Mobility: Walking and Moving Around Goal Status [phone](G8979): At least 20 percent but less than 40 percent impaired, limited or restricted    Malachi ProGalen R Klyde Banka, DPT 05/24/2016, 11:12 AM

## 2016-05-24 NOTE — ED Notes (Signed)
Md made aware of skin issue. Ordering nystatin powder to left groin mouth care provided to this patient.

## 2016-05-24 NOTE — ED Notes (Addendum)
[facility] [phone]), who stated that she is working on getting pt in to respite care at their facility for 5 days; family is also looking for placement [facility] or similar facility. Erie NoeVanessa to keep me posted.

## 2016-05-24 NOTE — ED Notes (Signed)
Per [facility] caregiver debbie med tech pt was mechanical soft diet, meds in applesauce crushed, fed self, incontinent, non ambulatory. Pt now cant drink from straw, had to hold head up to drink, leaning to right, pt cant cough up post nasal drip. MD notfied.

## 2016-05-24 NOTE — Progress Notes (Signed)
Patient's granddaughter Aaron Erickson called CSW back. Per Aaron Erickson [facility] and needs more care than [facility] provide. Aaron Erickson is agreeable to SNF search and understands the options may be limited. CSW contacted [facility] at [facility] who reported that she does not believe patient is hospice home appropriate at this time. CSW is pursuing SNF level of care, referral has been faxed out.   [facility] IncorporatedBailey Lendon George, LCSW [phone]

## 2016-05-24 NOTE — Progress Notes (Signed)
Clinical Social Worker (CSW) received verbal consult on yesterday 05/23/16 from MD that patient is from [facility] and the facility will not take him back. [facility] today and spoke with [facility] Belinda. Per Massie BougieBelinda patient has been a [facility] on the regular [facility]F side since 11/20/11. Per Massie BougieBelinda patient mostly uses a wheel chair and his primary contact is his granddaughter Carrolyn Meiersaige Brooks [phone]. Per PotterBelinda CSW will have to talk [facility] about patient being able to return. Per Edrick OhBelinda Laverne is not in yet and she will give her a message to call CSW back. CSW attempted to contact [place] however she did not answer and a voicemail was left. CSW will continue to follow and assist as needed.   [facility] Hughes IncorporatedBailey Cheyanne Lamison, LCSW [phone]

## 2016-05-24 NOTE — ED Provider Notes (Signed)
Social work has arranged care facility. Patient remains medically cleared.    Aaron Everyobert Wretha Laris, MD 05/24/16 1057

## 2016-05-24 NOTE — ED Notes (Signed)
PT awaiting transport to [facility]

## 2016-05-24 NOTE — NC FL2 (Signed)
[place] MEDICAID FL2 LEVEL OF CARE SCREENING TOOL     IDENTIFICATION  Patient Name: Aaron Erickson F [place]brook Birthdate: 04/19/1921 Sex: male Admission Date (Current Location): 05/23/2016  [place]   Facility and Address:  [facility], [address], [place], [place] [address]      Provider Number: [id number]  Attending Physician Name and Address:  No att. providers found  Relative Name and Phone Number:       Current Level of Care: Hospital Recommended Level of Care: Skilled Nursing Facility Prior Approval Number:    Date Approved/Denied:   PASRR Number:  ([id number] )  Discharge Plan: SNF    Current Diagnoses:  . Arthritis   . BPH (benign prostatic hyperplasia)   . Chronic pain   . Coronary artery disease   . GERD (gastroesophageal reflux disease)   . Hypertension   . Peptic ulcer      Orientation RESPIRATION BLADDER Height & Weight      (Disoriented X4)  Normal Incontinent Weight:   Height:     BEHAVIORAL SYMPTOMS/MOOD NEUROLOGICAL BOWEL NUTRITION STATUS   (none)  (none ) Continent Diet (Diet: DYS 3 )  AMBULATORY STATUS COMMUNICATION OF NEEDS Skin   Total Care Verbally Normal                       Personal Care Assistance Level of Assistance  Bathing, Feeding, Dressing Bathing Assistance: Maximum assistance Feeding assistance: Maximum assistance Dressing Assistance: Maximum assistance     Functional Limitations Info  Sight, Hearing, Speech Sight Info: Adequate Hearing Info: Adequate Speech Info: Adequate    SPECIAL CARE FACTORS FREQUENCY  PT (By licensed PT), OT (By licensed OT)     PT Frequency:  (5) OT Frequency:  (5)            Contractures      Additional Factors Info  Code Status, Allergies Code Status Info:  (Not on file. ) Allergies Info:  (Sulfa Antibiotics)           Current Medications (05/24/2016):  This is the current hospital active medication  list Current Facility-Administered Medications  Medication Dose Route Frequency Provider Last Rate Last Dose  . acetaminophen (TYLENOL) tablet 1,000 mg  1,000 mg Oral TID Nita Sicklearolina Veronese, MD   1,000 mg at 05/23/16 2332  . albuterol (PROVENTIL) (2.5 MG/3ML) 0.083% nebulizer solution 3 mL  3 mL Inhalation Q4H PRN Nita Sicklearolina Veronese, MD      . calcitonin (salmon) (MIACALCIN/FORTICAL) nasal spray 1 spray  1 spray Alternating Nares Daily Nita Sicklearolina Veronese, MD      . clopidogrel (PLAVIX) tablet 75 mg  75 mg Oral Daily Nita Sicklearolina Veronese, MD      . docusate sodium (COLACE) capsule 100 mg  100 mg Oral BID PRN Nita Sicklearolina Veronese, MD      . famotidine (PEPCID) tablet 20 mg  20 mg Oral Daily Nita Sicklearolina Veronese, MD      . fluticasone (FLONASE) 50 MCG/ACT nasal spray 2 spray  2 spray Each Nare Daily Nita Sicklearolina Veronese, MD      . gabapentin (NEURONTIN) capsule 200 mg  200 mg Oral Daily Nita Sicklearolina Veronese, MD      . losartan (COZAAR) tablet 25 mg  25 mg Oral Daily Nita Sicklearolina Veronese, MD      . magnesium hydroxide (MILK OF MAGNESIA) suspension 30 mL  30 mL Oral Daily PRN Nita Sicklearolina Veronese, MD      . nystatin (MYCOSTATIN/NYSTOP) topical powder  Topical TID Darci Currentandolph N Brown, MD      . polyvinyl alcohol (LIQUIFILM TEARS) 1.4 % ophthalmic solution 1 drop  1 drop Both Eyes BID Nita Sicklearolina Veronese, MD   1 drop at 05/24/16 0010  . senna (SENOKOT) tablet 17.2 mg  2 tablet Oral BID Nita Sicklearolina Veronese, MD   17.2 mg at 05/23/16 2329  . vitamin B-12 (CYANOCOBALAMIN) tablet 1,000 mcg  1,000 mcg Oral Daily Nita Sicklearolina Veronese, MD       Current Outpatient Prescriptions  Medication Sig Dispense Refill  . acetaminophen (TYLENOL) 500 MG tablet Take 1,000 mg by mouth 3 (three) times daily.    . calcitonin, salmon, (MIACALCIN/FORTICAL) 200 UNIT/ACT nasal spray Place 1 spray into alternate nostrils daily.    . cetirizine (ZYRTEC) 10 MG tablet Take 10 mg by mouth daily.    . clopidogrel (PLAVIX) 75 MG tablet Take 75 mg by mouth daily.     Marland Kitchen. docusate sodium (COLACE) 100 MG capsule Take 100 mg by mouth 2 (two) times daily as needed for mild constipation.    . fluticasone (FLONASE) 50 MCG/ACT nasal spray Place 2 sprays into both nostrils daily.    Marland Kitchen. gabapentin (NEURONTIN) 100 MG capsule Take 200 mg by mouth daily.     Marland Kitchen. losartan (COZAAR) 25 MG tablet Take 25 mg by mouth daily.     . magnesium hydroxide (MILK OF MAGNESIA) 400 MG/5ML suspension Take 30 mLs by mouth daily as needed for mild constipation.    Marland Kitchen. Propylene Glycol 0.6 % SOLN Apply 1 drop to eye 2 (two) times daily.    . ranitidine (ZANTAC) 300 MG tablet Take 300 mg by mouth 2 (two) times daily.     Marland Kitchen. scopolamine (TRANSDERM-SCOP, 1.5 MG,) 1 MG/3DAYS Place 1 patch onto the skin every 3 (three) days. Place behind ear.    . senna (SENOKOT) 8.6 MG TABS tablet Take 2 tablets by mouth 2 (two) times daily.    . vitamin B-12 (CYANOCOBALAMIN) 1000 MCG tablet Take 1,000 mcg by mouth daily.    . Calcium Carbonate-Vitamin D (CALCIUM + D PO) Take 1 tablet by mouth 2 (two) times daily with a meal.    . guaifenesin (ROBITUSSIN) 100 MG/5ML syrup Take 200 mg by mouth 4 (four) times daily as needed for cough.    . loperamide (IMODIUM) 2 MG capsule Take 2 mg by mouth as needed.     . Multiple Vitamins-Minerals (ICAPS AREDS FORMULA PO) Take 1 capsule by mouth daily.    . ondansetron (ZOFRAN) 4 MG tablet Take 4 mg by mouth every 8 (eight) hours as needed.     Marland Kitchen. PROAIR HFA 108 (90 BASE) MCG/ACT inhaler Inhale 2 puffs into the lungs every 4 (four) hours as needed.     . Skin Protectants, Misc. (DIMETHICONE-ZINC OXIDE) cream Apply 1 application topically 3 (three) times daily as needed for dry skin.    . Skin Protectants, Misc. (MINERIN) CREA Apply 1 application topically 2 (two) times daily.       Discharge Medications: Please see discharge summary for a list of discharge medications.  Relevant Imaging Results:  Relevant Lab Results:   Additional Information  (SSN:  [id number])  Avik Leoni, Darleen CrockerBailey M, LCSW

## 2016-05-24 NOTE — ED Notes (Signed)
Patient ate approximately 7 bites of oatmeal at this time and then states, "I don't want any more."

## 2016-06-08 DEATH — deceased

## 2016-08-10 IMAGING — CR DG CHEST 1V PORT
1 series · 1 of 1 positions shown · non-contrast
Comparison: 09/01/2011.

CLINICAL DATA: Nausea, abdominal pain and diarrhea since last
night.

EXAM:
PORTABLE CHEST - 1 VIEW

[ap]
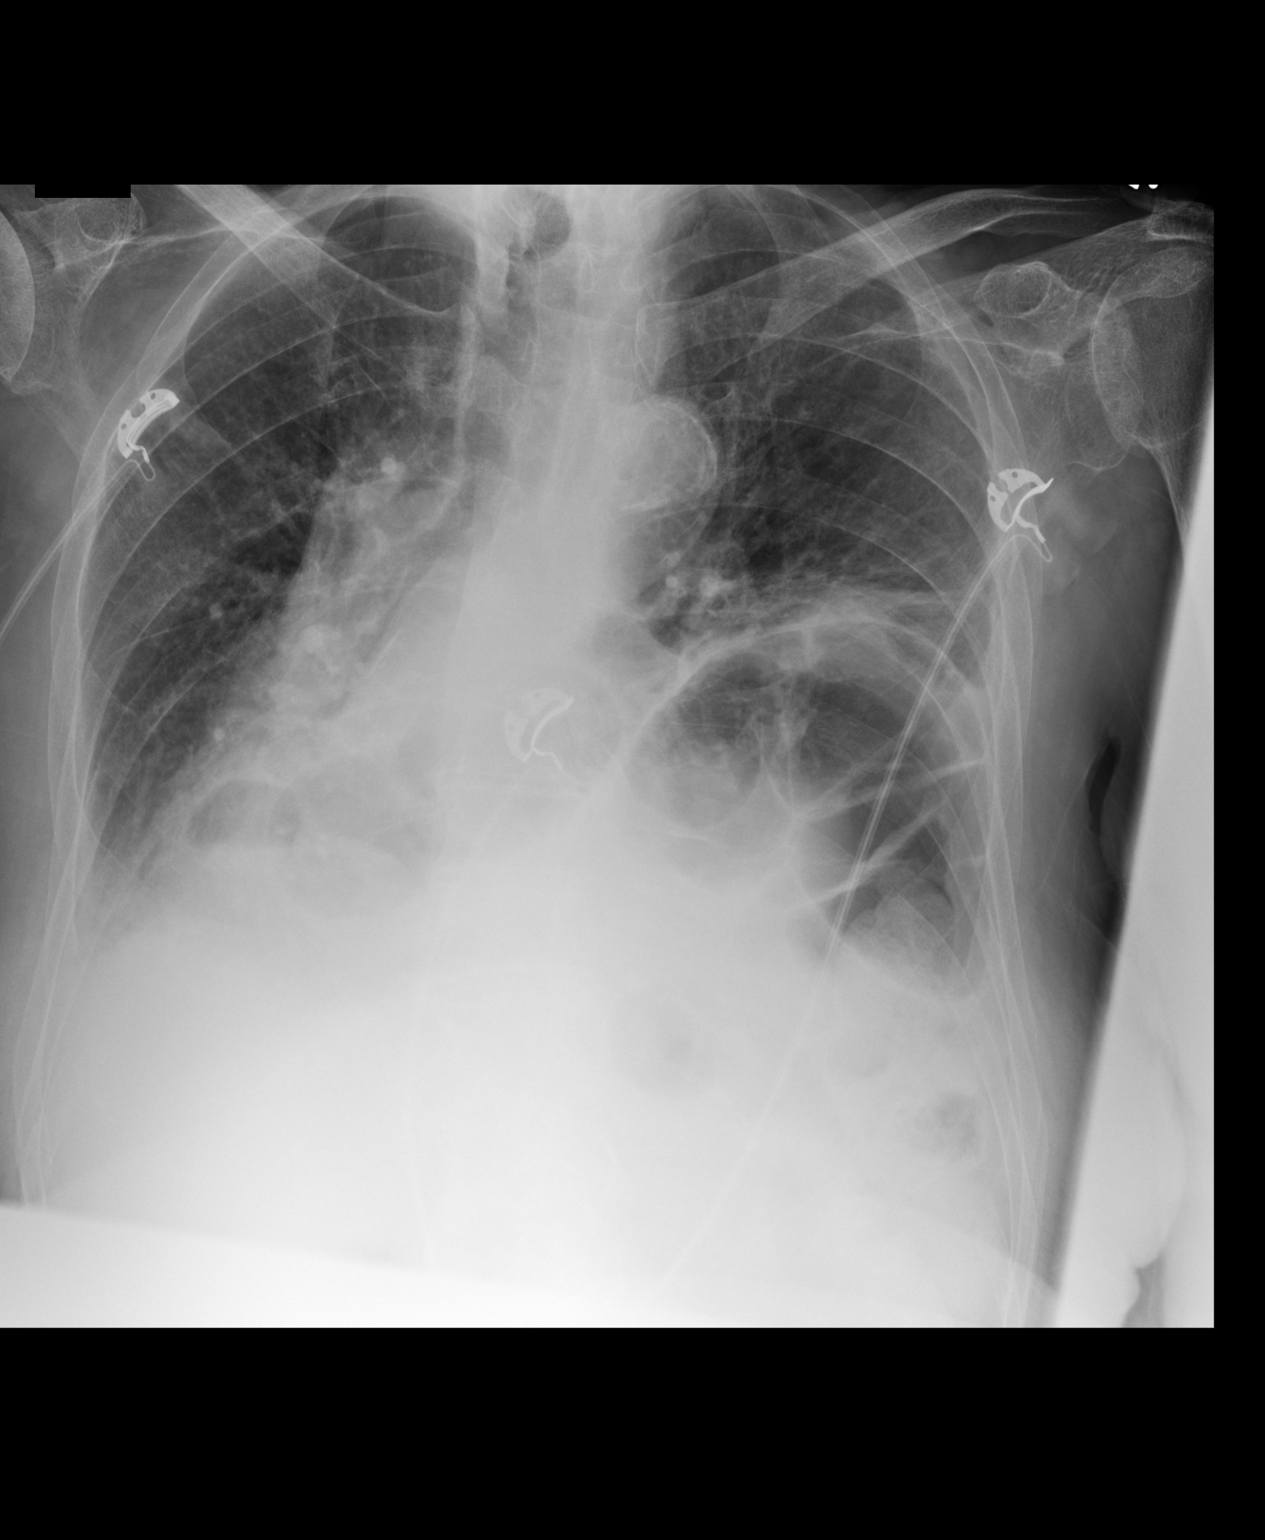

[1 of 1 positions shown; findings below may reference images not displayed]

FINDINGS: Stable elevation of the left hemidiaphragm. Increased size of the
previously demonstrated hiatal hernia. This currently appears to
contain bowel loops. Small bilateral pleural effusions. Grossly
normal sized heart. Mild prominence of the pulmonary vasculature and
interstitial markings, increased by a poor inspiration. Mild
bibasilar atelectasis.
IMPRESSION: 1. Poor inspiration with the bibasilar atelectasis.
2. Interval mild pulmonary vascular congestion and mild interstitial
lung disease with small bilateral pleural effusions.
3. Large hiatal hernia, currently appearing to contain bowel loops.
The

## 2016-08-12 IMAGING — CR DG CHEST 2V
1 series · 2 of 2 positions shown · non-contrast
Comparison: 10/31/2014, 09/29/2011

CLINICAL DATA: Productive cough for 1 month

EXAM:
CHEST  2 VIEW

[Series 1: dxr chest pa (or ap) and lateral · 0.14mm/px · 2 of 2 slices shown]
[im 1/2]
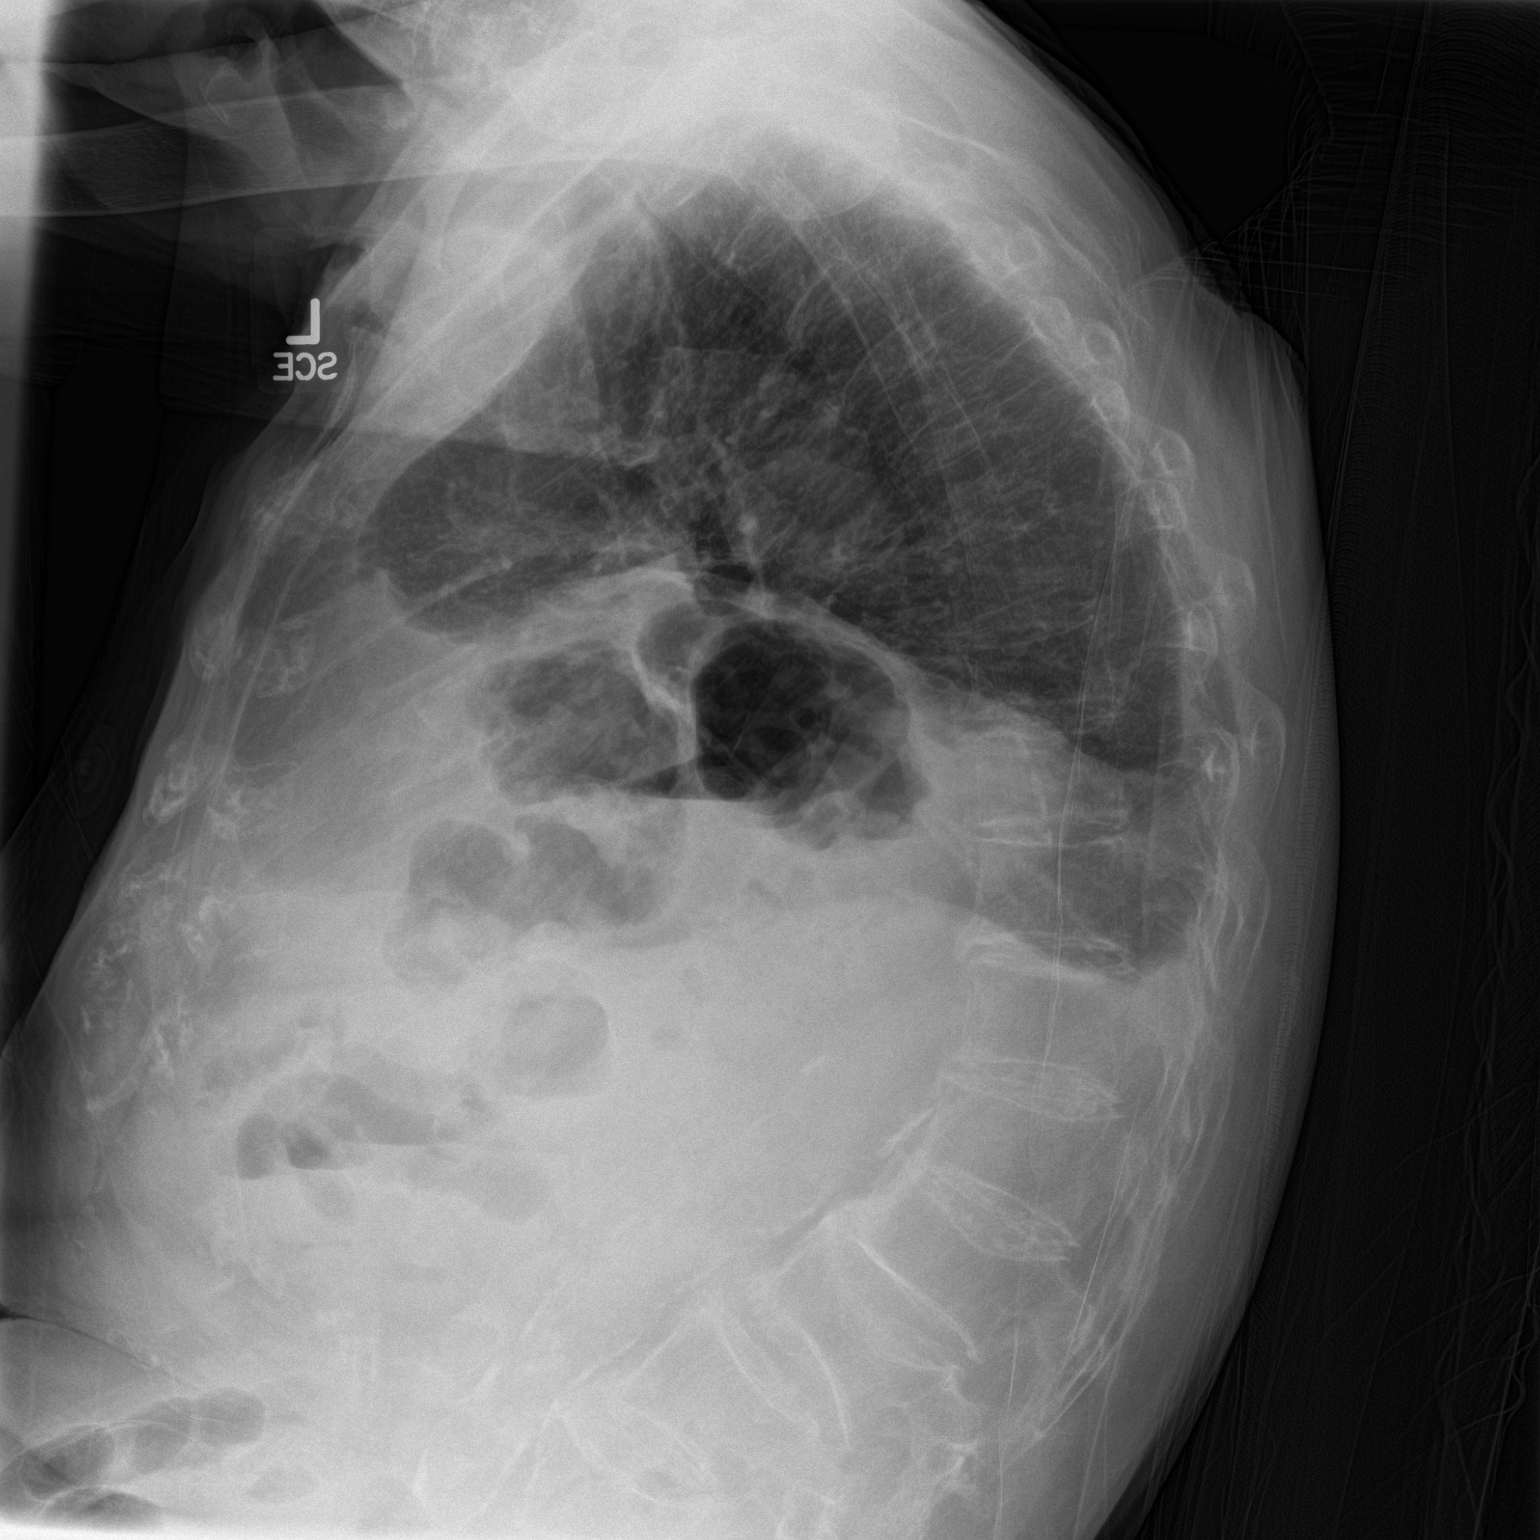
[im 2/2]
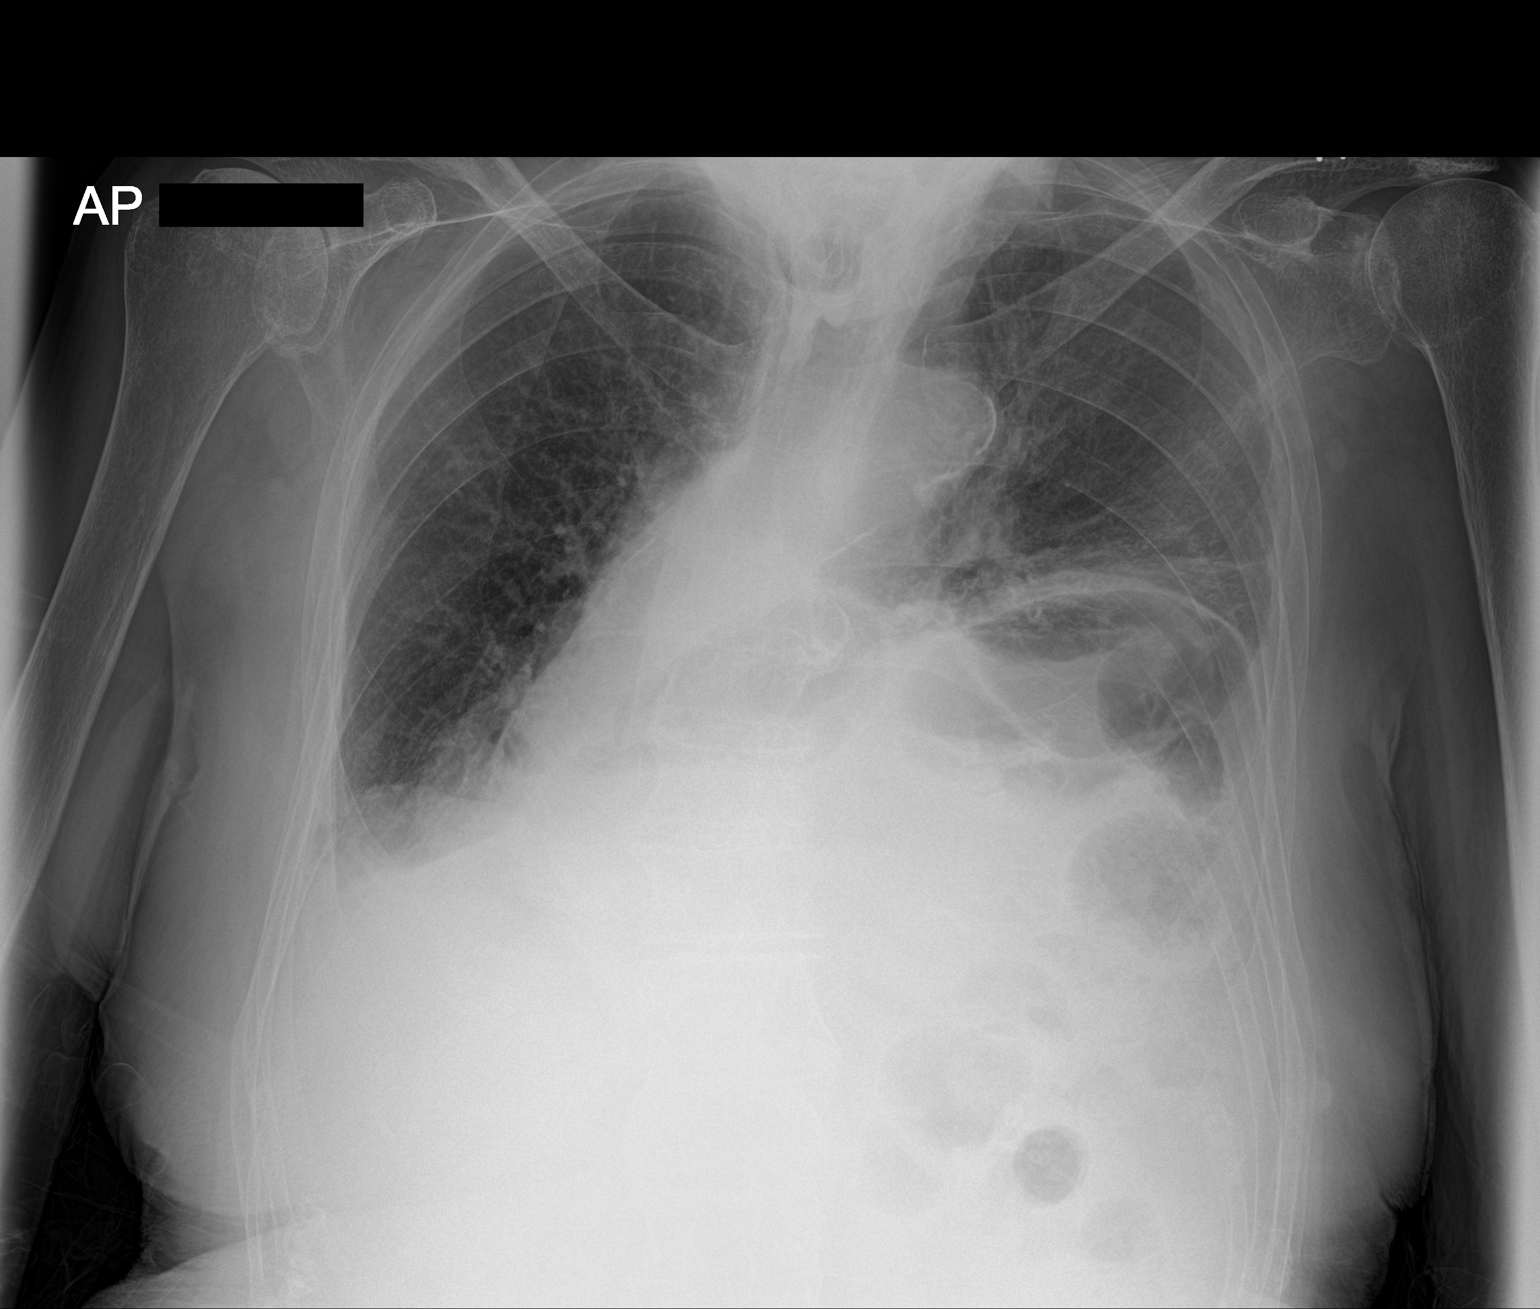

[2 of 2 positions shown; findings below may reference images not displayed]

FINDINGS: There is elevation of the left diaphragm. There is left basilar
atelectasis. There is a small right pleural effusion. There is no
focal consolidation or pneumothorax. The heart mediastinum are
stable. There is thoracic aortic atherosclerosis. There is
generalized osteopenia without acute osseous abnormality. There is a
chronic L1 vertebral body compression fracture.

There is a large hiatal hernia.
IMPRESSION: 1. Elevated left diaphragm with basilar atelectasis.
2. Small right pleural effusion.
3. Large hiatal hernia.

## 2018-03-03 IMAGING — CT CT MAXILLOFACIAL W/O CM
4 of 19 series · 9 of 47 positions shown, 10 images · non-contrast
Comparison: 11/10/2014 CT head

CLINICAL DATA: Unwitnessed fall. Patient anticoagulated. Poor
historian. Unsure of loss of consciousness.

EXAM:
CT HEAD WITHOUT CONTRAST
CT MAXILLOFACIAL WITHOUT CONTRAST
CT CERVICAL SPINE WITHOUT CONTRAST
TECHNIQUE: Multidetector CT imaging of the head, cervical spine, and
maxillofacial structures were performed using the standard protocol
without intravenous contrast. Multiplanar CT image reconstructions
of the cervical spine and maxillofacial structures were also
generated.

[Series 12: orthogonal axials · axial · 0.32mm/px · z∈[-260,-213]mm · 2 of 81 slices shown]
[im 27/81  bone]
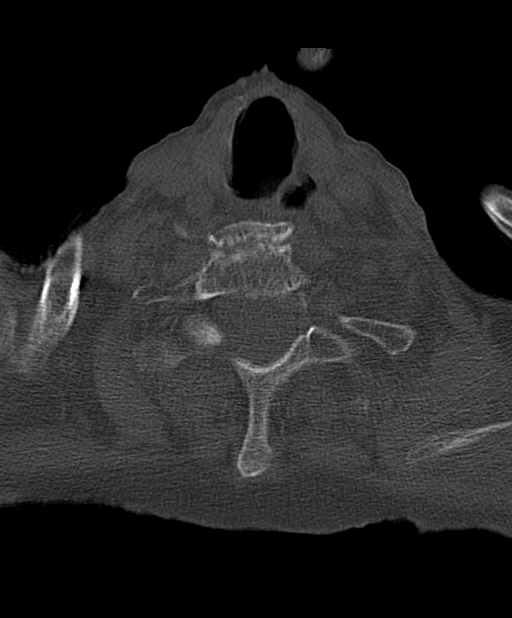
[im 54/81  bone]
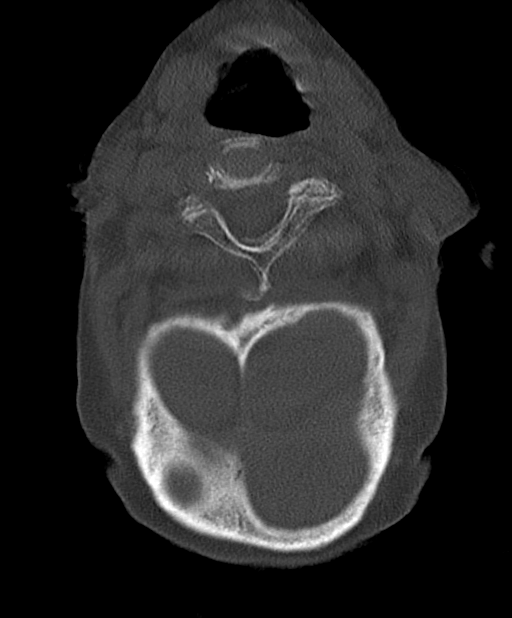

[Series 14: max soft · axial · 0.35mm/px · z∈[-173,-73]mm · 3 of 101 slices shown]
[im 26/101  brain]
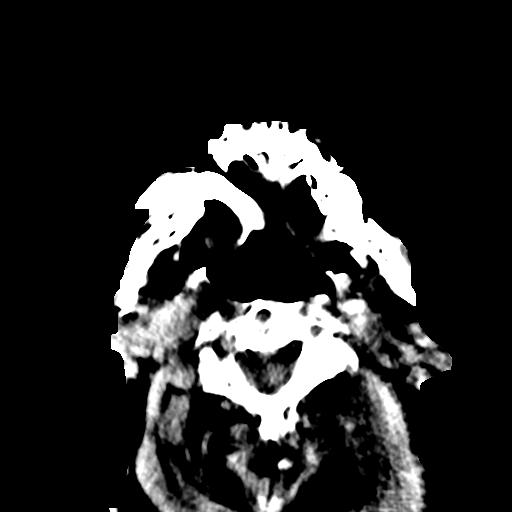
[im 51/101  brain]
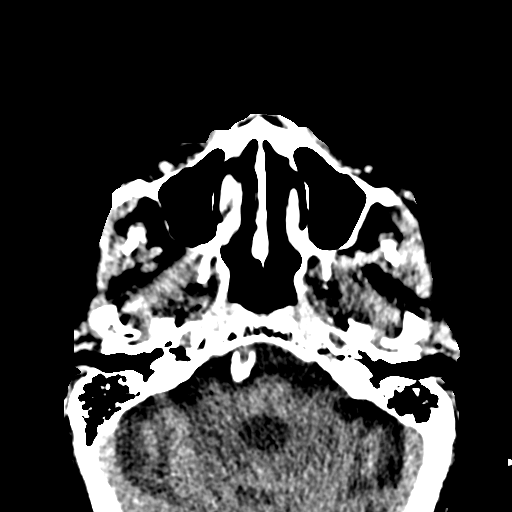
[im 76/101  brain]
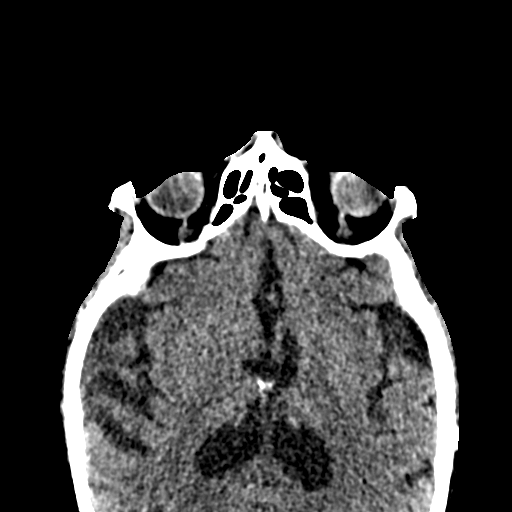

[Series 15: ax max bone · axial · 0.35mm/px · z∈[-180,-80]mm · 3 of 101 slices shown, 4 images]
[im 26/101  brain]
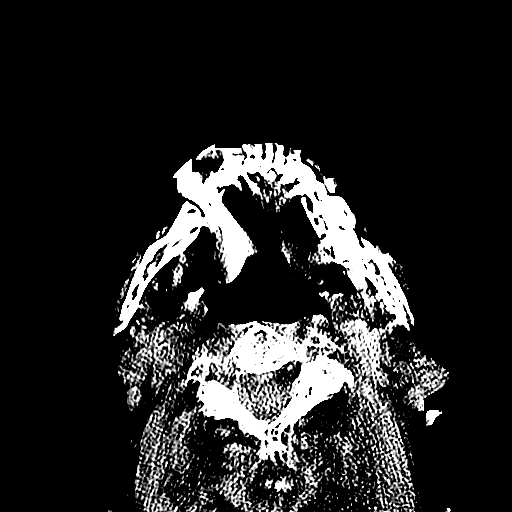
[im 26/101  bone]
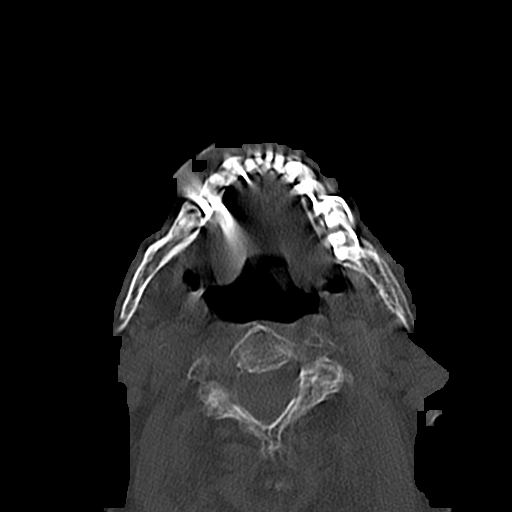
[im 51/101  bone]
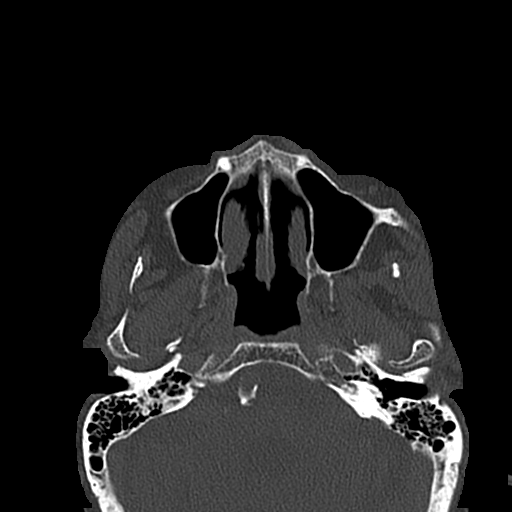
[im 76/101  bone]
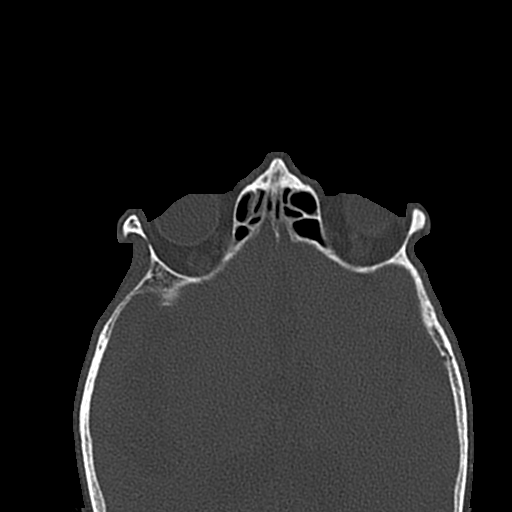

[Series 17: coronal soft · coronal · 0.37mm/px · 1 of 91 slices shown]
[im 46/91  bone]
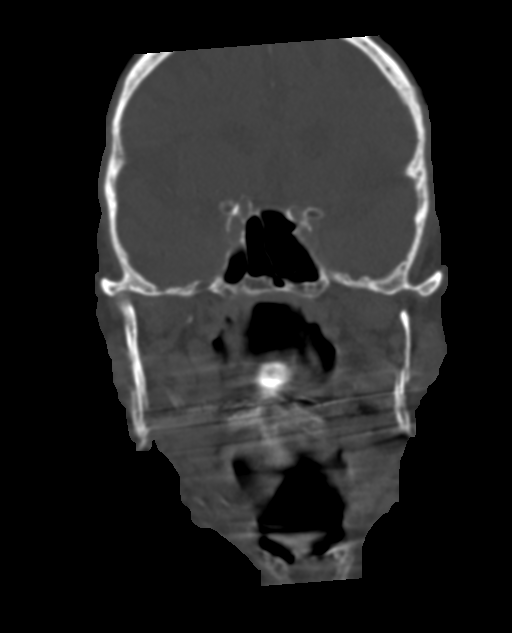

[9 of 47 positions shown; findings below may reference images not displayed]

FINDINGS: The patient was unable to remain motionless for the exam. Small or
subtle lesions could be overlooked.

CT HEAD FINDINGS

Brain: No evidence for acute infarction, hemorrhage, mass lesion,
hydrocephalus, or extra-axial fluid. Advanced atrophy. Chronic
microvascular ischemic change.

Vascular: Advanced vascular calcification.  No hyperdense vessel.

Skull: Normal. Negative for fracture or focal lesion.

Other: Large RIGHT greater than LEFT frontal scalp hematoma with
laceration. No visible foreign body.

CT MAXILLOFACIAL FINDINGS

Osseous: No facial fracture.

Orbits: No orbital hematoma. BILATERAL cataract extraction. No
blowout injury.

Sinuses: No layering sinus fluid.

Soft tissues: RIGHT greater than LEFT frontal scalp hematoma.

CT CERVICAL SPINE FINDINGS

Alignment: No traumatic subluxation. Facet mediated anterolisthesis
of a mild nature at C4-5, C5-6, and C6-7. Exaggerated lordosis.

Skull base and vertebrae: No skull base injury. There is a cortical
discontinuity across the anterior base odontoid, seen best on
sagittal reformatted images such as image 36 series 4. This is
difficult to confirm on axial images, but does appear consistent on
coronal imaging. Nondisplaced type 2 odontoid fracture cannot be
excluded. If no contraindications, when the patient is more
cooperative, MRI could provide additional information. Mild pannus.

Soft tissues and spinal canal: No significant prevertebral soft
tissue swelling.

Disc levels: Multilevel disc space narrowing worst at C5-6 and C6-7.

Upper chest: No pneumothorax or infiltrate.

Other: Carotid atherosclerosis.
IMPRESSION: No skull fracture or intracranial hemorrhage. Large bifrontal scalp
hematoma and laceration, worse on the RIGHT.

Motion degraded exam, but concern raised for nondisplaced type 2
odontoid fracture, with cortical discontinuity anteriorly at C2.

No facial fracture or significant sinus opacity.

These results were called by telephone at the time of interpretation
on 05/23/2016 at [DATE] to Dr. Mathai who verbally acknowledged
these results.
